# Patient Record
Sex: Male | Born: 2000 | Race: White | Hispanic: No | Marital: Married | Smoking: Former smoker
Health system: Southern US, Community
[De-identification: ages and names within clinical notes are randomized; demographics above are authoritative.]

---
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
## 2019-01-03 ENCOUNTER — Ambulatory Visit (INDEPENDENT_AMBULATORY_CARE_PROVIDER_SITE_OTHER): Payer: Medicaid Other | Admitting: *Deleted

## 2019-01-03 DIAGNOSIS — J309 Allergic rhinitis, unspecified: Secondary | ICD-10-CM

## 2019-01-16 ENCOUNTER — Ambulatory Visit (INDEPENDENT_AMBULATORY_CARE_PROVIDER_SITE_OTHER): Payer: Medicaid Other | Admitting: *Deleted

## 2019-01-16 DIAGNOSIS — J309 Allergic rhinitis, unspecified: Secondary | ICD-10-CM

## 2019-01-21 ENCOUNTER — Ambulatory Visit (INDEPENDENT_AMBULATORY_CARE_PROVIDER_SITE_OTHER): Payer: Medicaid Other | Admitting: *Deleted

## 2019-01-21 DIAGNOSIS — J309 Allergic rhinitis, unspecified: Secondary | ICD-10-CM | POA: Diagnosis not present

## 2019-02-07 ENCOUNTER — Ambulatory Visit (INDEPENDENT_AMBULATORY_CARE_PROVIDER_SITE_OTHER): Payer: Medicaid Other | Admitting: *Deleted

## 2019-02-07 DIAGNOSIS — J309 Allergic rhinitis, unspecified: Secondary | ICD-10-CM | POA: Diagnosis not present

## 2019-02-15 ENCOUNTER — Ambulatory Visit (INDEPENDENT_AMBULATORY_CARE_PROVIDER_SITE_OTHER): Payer: Medicaid Other | Admitting: *Deleted

## 2019-02-15 DIAGNOSIS — J309 Allergic rhinitis, unspecified: Secondary | ICD-10-CM

## 2019-02-22 ENCOUNTER — Ambulatory Visit (INDEPENDENT_AMBULATORY_CARE_PROVIDER_SITE_OTHER): Payer: Medicaid Other | Admitting: *Deleted

## 2019-02-22 DIAGNOSIS — J309 Allergic rhinitis, unspecified: Secondary | ICD-10-CM

## 2019-02-28 ENCOUNTER — Ambulatory Visit (INDEPENDENT_AMBULATORY_CARE_PROVIDER_SITE_OTHER): Payer: Medicaid Other

## 2019-02-28 DIAGNOSIS — J309 Allergic rhinitis, unspecified: Secondary | ICD-10-CM | POA: Diagnosis not present

## 2019-03-14 ENCOUNTER — Ambulatory Visit (INDEPENDENT_AMBULATORY_CARE_PROVIDER_SITE_OTHER): Payer: Medicaid Other | Admitting: *Deleted

## 2019-03-14 DIAGNOSIS — J309 Allergic rhinitis, unspecified: Secondary | ICD-10-CM | POA: Diagnosis not present

## 2019-03-21 ENCOUNTER — Ambulatory Visit (INDEPENDENT_AMBULATORY_CARE_PROVIDER_SITE_OTHER): Payer: Medicaid Other | Admitting: *Deleted

## 2019-03-21 DIAGNOSIS — J309 Allergic rhinitis, unspecified: Secondary | ICD-10-CM | POA: Diagnosis not present

## 2019-03-25 ENCOUNTER — Ambulatory Visit (INDEPENDENT_AMBULATORY_CARE_PROVIDER_SITE_OTHER): Payer: Medicaid Other

## 2019-03-25 DIAGNOSIS — J309 Allergic rhinitis, unspecified: Secondary | ICD-10-CM | POA: Diagnosis not present

## 2019-04-03 ENCOUNTER — Ambulatory Visit (INDEPENDENT_AMBULATORY_CARE_PROVIDER_SITE_OTHER): Payer: Medicaid Other

## 2019-04-03 DIAGNOSIS — J309 Allergic rhinitis, unspecified: Secondary | ICD-10-CM | POA: Diagnosis not present

## 2019-04-11 ENCOUNTER — Ambulatory Visit (INDEPENDENT_AMBULATORY_CARE_PROVIDER_SITE_OTHER): Payer: Medicaid Other | Admitting: *Deleted

## 2019-04-11 DIAGNOSIS — J309 Allergic rhinitis, unspecified: Secondary | ICD-10-CM

## 2019-04-17 ENCOUNTER — Ambulatory Visit (INDEPENDENT_AMBULATORY_CARE_PROVIDER_SITE_OTHER): Payer: Medicaid Other | Admitting: *Deleted

## 2019-04-17 DIAGNOSIS — J309 Allergic rhinitis, unspecified: Secondary | ICD-10-CM

## 2019-04-24 ENCOUNTER — Ambulatory Visit (INDEPENDENT_AMBULATORY_CARE_PROVIDER_SITE_OTHER): Payer: Medicaid Other

## 2019-04-24 DIAGNOSIS — J309 Allergic rhinitis, unspecified: Secondary | ICD-10-CM | POA: Diagnosis not present

## 2019-05-08 ENCOUNTER — Ambulatory Visit (INDEPENDENT_AMBULATORY_CARE_PROVIDER_SITE_OTHER): Payer: Medicaid Other | Admitting: *Deleted

## 2019-05-08 DIAGNOSIS — J309 Allergic rhinitis, unspecified: Secondary | ICD-10-CM | POA: Diagnosis not present

## 2019-05-13 ENCOUNTER — Ambulatory Visit (INDEPENDENT_AMBULATORY_CARE_PROVIDER_SITE_OTHER): Payer: Medicaid Other | Admitting: *Deleted

## 2019-05-13 DIAGNOSIS — J309 Allergic rhinitis, unspecified: Secondary | ICD-10-CM

## 2019-05-22 ENCOUNTER — Ambulatory Visit (INDEPENDENT_AMBULATORY_CARE_PROVIDER_SITE_OTHER): Payer: Medicaid Other

## 2019-05-22 DIAGNOSIS — J309 Allergic rhinitis, unspecified: Secondary | ICD-10-CM | POA: Diagnosis not present

## 2019-05-22 MED ORDER — EPINEPHRINE 0.3 MG/0.3ML IJ SOAJ: 0.3000 mg | INTRAMUSCULAR | 3 refills | Status: DC | PRN

## 2019-05-22 NOTE — Progress Notes (Signed)
Reaction note Patient was given 0.5 mL red vial of the grass/tree and 0.5 mL of dust mitt/weed. Patient notified of the error in dosing and continued to wait in the waiting area. After 15 minutes he reported feeling itchy and scattered hives appeared. He was taken into an exam room where his hives continued to increase. He denied shortness of breath, dizziness, and abdominal pain. He was given Benadryl 50 mg, famotidine 20 mg, and cetirizine 10 mg. His hives persisted and Dr. Selena Batten was notified. He was given 0.3 mg epinephrine. Vital signs are stable and patient is resting in the recumbent position. His family is getting the EpiPen prescription filled at the pharmacy and will drive him home after we release him. Patient informed about possible biphasic reaction and questions are answered. He will call the clinic or 911 with any further reactions. Prednisone 30 mg given one time before leaving the clinic.

## 2019-05-23 MED ORDER — EPINEPHRINE (ANAPHYLAXIS) 1 MG/ML IJ SOLN
0.3000 mL | Freq: Once | INTRAMUSCULAR | Status: AC
  Administered 2019-05-23: 0.3 mg via INTRAMUSCULAR

## 2019-05-23 NOTE — Progress Notes (Signed)
Patient received 0.50 mL out of grass/tree first red vial and 0.50 mL out of dust mite/weed first red vial. Thurston Hole was notified of dosing error and patient was notified of the dosing error and agreed to wait in office for 30 minutes for monitoring. After 15-20 minutes, patient began reporting symptoms of itching and showing signs of hives on arms, neck and torso. I was instructed to give him Benadryl 50mg , Famotidine 20mg , and Cetirizine 10 mg. After 20 minutes, his hives persisted and Dr. was called and she instructed me to administer Epi 0.3mg . Vital signs were monitored. Patient stated he did not have a epipen so one was sent to his pharmacy and his mother went to pharmacy and picked it up then came to the office. Patient was aware that he would need a driver. Patient and mom were educated on a possible second reaction and informed to administer epi and call 911 should he have another reaction.  I called the patient this morning (05/23/2019) and he stated that he remained stable the rest of the night and slept well and no other reactions occurred.

## 2019-05-30 ENCOUNTER — Ambulatory Visit (INDEPENDENT_AMBULATORY_CARE_PROVIDER_SITE_OTHER): Payer: Medicaid Other | Admitting: *Deleted

## 2019-05-30 DIAGNOSIS — J309 Allergic rhinitis, unspecified: Secondary | ICD-10-CM | POA: Diagnosis not present

## 2019-06-07 ENCOUNTER — Ambulatory Visit (INDEPENDENT_AMBULATORY_CARE_PROVIDER_SITE_OTHER): Payer: Medicaid Other

## 2019-06-07 DIAGNOSIS — J309 Allergic rhinitis, unspecified: Secondary | ICD-10-CM | POA: Diagnosis not present

## 2019-06-14 ENCOUNTER — Ambulatory Visit (INDEPENDENT_AMBULATORY_CARE_PROVIDER_SITE_OTHER): Payer: Medicaid Other

## 2019-06-14 DIAGNOSIS — J309 Allergic rhinitis, unspecified: Secondary | ICD-10-CM | POA: Diagnosis not present

## 2019-06-25 ENCOUNTER — Ambulatory Visit (INDEPENDENT_AMBULATORY_CARE_PROVIDER_SITE_OTHER): Payer: Medicaid Other | Admitting: *Deleted

## 2019-06-25 DIAGNOSIS — J309 Allergic rhinitis, unspecified: Secondary | ICD-10-CM

## 2019-07-08 ENCOUNTER — Ambulatory Visit (INDEPENDENT_AMBULATORY_CARE_PROVIDER_SITE_OTHER): Payer: Medicaid Other | Admitting: *Deleted

## 2019-07-08 DIAGNOSIS — J309 Allergic rhinitis, unspecified: Secondary | ICD-10-CM

## 2019-07-15 ENCOUNTER — Ambulatory Visit (INDEPENDENT_AMBULATORY_CARE_PROVIDER_SITE_OTHER): Payer: Medicaid Other | Admitting: *Deleted

## 2019-07-15 DIAGNOSIS — J309 Allergic rhinitis, unspecified: Secondary | ICD-10-CM | POA: Diagnosis not present

## 2019-07-16 DIAGNOSIS — J3089 Other allergic rhinitis: Secondary | ICD-10-CM

## 2019-07-16 NOTE — Progress Notes (Signed)
VIALS EXP 07-15-20 

## 2019-08-02 ENCOUNTER — Ambulatory Visit (INDEPENDENT_AMBULATORY_CARE_PROVIDER_SITE_OTHER): Payer: Medicaid Other | Admitting: *Deleted

## 2019-08-02 DIAGNOSIS — J309 Allergic rhinitis, unspecified: Secondary | ICD-10-CM

## 2019-08-08 ENCOUNTER — Ambulatory Visit (INDEPENDENT_AMBULATORY_CARE_PROVIDER_SITE_OTHER): Payer: Medicaid Other | Admitting: *Deleted

## 2019-08-08 DIAGNOSIS — J309 Allergic rhinitis, unspecified: Secondary | ICD-10-CM

## 2019-08-12 ENCOUNTER — Ambulatory Visit (INDEPENDENT_AMBULATORY_CARE_PROVIDER_SITE_OTHER): Payer: Medicaid Other | Admitting: *Deleted

## 2019-08-12 DIAGNOSIS — J309 Allergic rhinitis, unspecified: Secondary | ICD-10-CM

## 2019-08-20 ENCOUNTER — Ambulatory Visit (INDEPENDENT_AMBULATORY_CARE_PROVIDER_SITE_OTHER): Payer: Medicaid Other | Admitting: *Deleted

## 2019-08-20 DIAGNOSIS — J309 Allergic rhinitis, unspecified: Secondary | ICD-10-CM | POA: Diagnosis not present

## 2019-08-26 ENCOUNTER — Ambulatory Visit (INDEPENDENT_AMBULATORY_CARE_PROVIDER_SITE_OTHER): Payer: Medicaid Other | Admitting: *Deleted

## 2019-08-26 DIAGNOSIS — J309 Allergic rhinitis, unspecified: Secondary | ICD-10-CM | POA: Diagnosis not present

## 2019-09-20 ENCOUNTER — Ambulatory Visit (INDEPENDENT_AMBULATORY_CARE_PROVIDER_SITE_OTHER): Payer: Medicaid Other

## 2019-09-20 DIAGNOSIS — J309 Allergic rhinitis, unspecified: Secondary | ICD-10-CM | POA: Diagnosis not present

## 2019-09-27 ENCOUNTER — Ambulatory Visit (INDEPENDENT_AMBULATORY_CARE_PROVIDER_SITE_OTHER): Payer: Medicaid Other | Admitting: *Deleted

## 2019-09-27 DIAGNOSIS — J309 Allergic rhinitis, unspecified: Secondary | ICD-10-CM | POA: Diagnosis not present

## 2019-10-02 ENCOUNTER — Ambulatory Visit (INDEPENDENT_AMBULATORY_CARE_PROVIDER_SITE_OTHER): Payer: Medicaid Other | Admitting: *Deleted

## 2019-10-02 DIAGNOSIS — J309 Allergic rhinitis, unspecified: Secondary | ICD-10-CM

## 2019-10-09 ENCOUNTER — Ambulatory Visit (INDEPENDENT_AMBULATORY_CARE_PROVIDER_SITE_OTHER): Payer: Medicaid Other | Admitting: *Deleted

## 2019-10-09 DIAGNOSIS — J309 Allergic rhinitis, unspecified: Secondary | ICD-10-CM | POA: Diagnosis not present

## 2019-10-16 ENCOUNTER — Ambulatory Visit (INDEPENDENT_AMBULATORY_CARE_PROVIDER_SITE_OTHER): Payer: Medicaid Other | Admitting: *Deleted

## 2019-10-16 DIAGNOSIS — J309 Allergic rhinitis, unspecified: Secondary | ICD-10-CM

## 2019-10-21 ENCOUNTER — Ambulatory Visit (INDEPENDENT_AMBULATORY_CARE_PROVIDER_SITE_OTHER): Payer: Medicaid Other

## 2019-10-21 DIAGNOSIS — J309 Allergic rhinitis, unspecified: Secondary | ICD-10-CM

## 2019-11-01 ENCOUNTER — Ambulatory Visit (INDEPENDENT_AMBULATORY_CARE_PROVIDER_SITE_OTHER): Payer: Medicaid Other

## 2019-11-01 DIAGNOSIS — J309 Allergic rhinitis, unspecified: Secondary | ICD-10-CM | POA: Diagnosis not present

## 2019-11-19 DIAGNOSIS — J301 Allergic rhinitis due to pollen: Secondary | ICD-10-CM

## 2019-11-19 NOTE — Progress Notes (Signed)
Vials exp 11-18-20

## 2019-11-20 ENCOUNTER — Ambulatory Visit (INDEPENDENT_AMBULATORY_CARE_PROVIDER_SITE_OTHER): Payer: Medicaid Other | Admitting: *Deleted

## 2019-11-20 DIAGNOSIS — J309 Allergic rhinitis, unspecified: Secondary | ICD-10-CM

## 2019-11-21 DIAGNOSIS — J3089 Other allergic rhinitis: Secondary | ICD-10-CM

## 2019-11-25 ENCOUNTER — Ambulatory Visit (INDEPENDENT_AMBULATORY_CARE_PROVIDER_SITE_OTHER): Payer: Medicaid Other | Admitting: *Deleted

## 2019-11-25 DIAGNOSIS — J309 Allergic rhinitis, unspecified: Secondary | ICD-10-CM

## 2019-12-02 ENCOUNTER — Ambulatory Visit (INDEPENDENT_AMBULATORY_CARE_PROVIDER_SITE_OTHER): Payer: Medicaid Other | Admitting: *Deleted

## 2019-12-02 DIAGNOSIS — J309 Allergic rhinitis, unspecified: Secondary | ICD-10-CM

## 2020-01-01 ENCOUNTER — Ambulatory Visit (INDEPENDENT_AMBULATORY_CARE_PROVIDER_SITE_OTHER): Payer: Medicaid Other | Admitting: *Deleted

## 2020-01-01 DIAGNOSIS — J309 Allergic rhinitis, unspecified: Secondary | ICD-10-CM

## 2020-01-09 ENCOUNTER — Ambulatory Visit (INDEPENDENT_AMBULATORY_CARE_PROVIDER_SITE_OTHER): Payer: Medicaid Other | Admitting: *Deleted

## 2020-01-09 DIAGNOSIS — J309 Allergic rhinitis, unspecified: Secondary | ICD-10-CM

## 2020-01-21 ENCOUNTER — Ambulatory Visit (INDEPENDENT_AMBULATORY_CARE_PROVIDER_SITE_OTHER): Payer: Medicaid Other | Admitting: *Deleted

## 2020-01-21 DIAGNOSIS — J309 Allergic rhinitis, unspecified: Secondary | ICD-10-CM | POA: Diagnosis not present

## 2020-01-30 ENCOUNTER — Ambulatory Visit (INDEPENDENT_AMBULATORY_CARE_PROVIDER_SITE_OTHER): Payer: Medicaid Other

## 2020-01-30 DIAGNOSIS — J309 Allergic rhinitis, unspecified: Secondary | ICD-10-CM

## 2020-02-03 ENCOUNTER — Ambulatory Visit (INDEPENDENT_AMBULATORY_CARE_PROVIDER_SITE_OTHER): Payer: Medicaid Other

## 2020-02-03 DIAGNOSIS — J309 Allergic rhinitis, unspecified: Secondary | ICD-10-CM

## 2020-02-26 ENCOUNTER — Ambulatory Visit (INDEPENDENT_AMBULATORY_CARE_PROVIDER_SITE_OTHER): Payer: Medicaid Other

## 2020-02-26 DIAGNOSIS — J309 Allergic rhinitis, unspecified: Secondary | ICD-10-CM | POA: Diagnosis not present

## 2020-03-02 ENCOUNTER — Ambulatory Visit (INDEPENDENT_AMBULATORY_CARE_PROVIDER_SITE_OTHER): Payer: Medicaid Other | Admitting: *Deleted

## 2020-03-02 DIAGNOSIS — J309 Allergic rhinitis, unspecified: Secondary | ICD-10-CM | POA: Diagnosis not present

## 2020-03-16 ENCOUNTER — Ambulatory Visit (INDEPENDENT_AMBULATORY_CARE_PROVIDER_SITE_OTHER): Payer: Medicaid Other | Admitting: *Deleted

## 2020-03-16 DIAGNOSIS — J309 Allergic rhinitis, unspecified: Secondary | ICD-10-CM

## 2020-03-17 DIAGNOSIS — J302 Other seasonal allergic rhinitis: Secondary | ICD-10-CM

## 2020-03-17 NOTE — Progress Notes (Signed)
VIALS EXP 03-17-21 °

## 2020-03-18 DIAGNOSIS — J3089 Other allergic rhinitis: Secondary | ICD-10-CM

## 2020-03-25 ENCOUNTER — Encounter: Payer: Self-pay | Admitting: Allergy and Immunology

## 2020-03-30 ENCOUNTER — Ambulatory Visit (INDEPENDENT_AMBULATORY_CARE_PROVIDER_SITE_OTHER): Payer: Medicaid Other

## 2020-03-30 DIAGNOSIS — J309 Allergic rhinitis, unspecified: Secondary | ICD-10-CM

## 2020-04-27 ENCOUNTER — Ambulatory Visit (INDEPENDENT_AMBULATORY_CARE_PROVIDER_SITE_OTHER): Payer: Medicaid Other

## 2020-04-27 DIAGNOSIS — J309 Allergic rhinitis, unspecified: Secondary | ICD-10-CM

## 2020-05-21 ENCOUNTER — Ambulatory Visit (INDEPENDENT_AMBULATORY_CARE_PROVIDER_SITE_OTHER): Payer: Medicaid Other | Admitting: *Deleted

## 2020-05-21 DIAGNOSIS — J309 Allergic rhinitis, unspecified: Secondary | ICD-10-CM

## 2020-05-28 ENCOUNTER — Ambulatory Visit (INDEPENDENT_AMBULATORY_CARE_PROVIDER_SITE_OTHER): Payer: Medicaid Other | Admitting: *Deleted

## 2020-05-28 DIAGNOSIS — J309 Allergic rhinitis, unspecified: Secondary | ICD-10-CM | POA: Diagnosis not present

## 2020-06-08 ENCOUNTER — Ambulatory Visit (INDEPENDENT_AMBULATORY_CARE_PROVIDER_SITE_OTHER): Payer: Medicaid Other | Admitting: *Deleted

## 2020-06-08 DIAGNOSIS — J309 Allergic rhinitis, unspecified: Secondary | ICD-10-CM
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
## 2020-06-15 ENCOUNTER — Ambulatory Visit (INDEPENDENT_AMBULATORY_CARE_PROVIDER_SITE_OTHER): Payer: Medicaid Other

## 2020-06-15 DIAGNOSIS — J309 Allergic rhinitis, unspecified: Secondary | ICD-10-CM

## 2020-07-01 ENCOUNTER — Ambulatory Visit (INDEPENDENT_AMBULATORY_CARE_PROVIDER_SITE_OTHER): Payer: Medicaid Other

## 2020-07-01 DIAGNOSIS — J309 Allergic rhinitis, unspecified: Secondary | ICD-10-CM

## 2020-07-09 ENCOUNTER — Ambulatory Visit (INDEPENDENT_AMBULATORY_CARE_PROVIDER_SITE_OTHER): Payer: Medicaid Other | Admitting: *Deleted

## 2020-07-09 DIAGNOSIS — J309 Allergic rhinitis, unspecified: Secondary | ICD-10-CM | POA: Diagnosis not present

## 2020-07-16 ENCOUNTER — Ambulatory Visit (INDEPENDENT_AMBULATORY_CARE_PROVIDER_SITE_OTHER): Payer: Medicaid Other | Admitting: *Deleted

## 2020-07-16 DIAGNOSIS — J309 Allergic rhinitis, unspecified: Secondary | ICD-10-CM

## 2020-08-17 ENCOUNTER — Ambulatory Visit (INDEPENDENT_AMBULATORY_CARE_PROVIDER_SITE_OTHER): Payer: Medicaid Other | Admitting: *Deleted

## 2020-08-17 DIAGNOSIS — J309 Allergic rhinitis, unspecified: Secondary | ICD-10-CM | POA: Diagnosis not present

## 2020-09-10 ENCOUNTER — Ambulatory Visit (INDEPENDENT_AMBULATORY_CARE_PROVIDER_SITE_OTHER): Payer: Medicaid Other | Admitting: *Deleted

## 2020-09-10 DIAGNOSIS — J309 Allergic rhinitis, unspecified: Secondary | ICD-10-CM

## 2020-09-23 ENCOUNTER — Ambulatory Visit (INDEPENDENT_AMBULATORY_CARE_PROVIDER_SITE_OTHER): Payer: Medicaid Other | Admitting: *Deleted

## 2020-09-23 DIAGNOSIS — J309 Allergic rhinitis, unspecified: Secondary | ICD-10-CM | POA: Diagnosis not present

## 2020-09-30 ENCOUNTER — Ambulatory Visit (INDEPENDENT_AMBULATORY_CARE_PROVIDER_SITE_OTHER): Payer: Medicaid Other | Admitting: *Deleted

## 2020-09-30 DIAGNOSIS — J309 Allergic rhinitis, unspecified: Secondary | ICD-10-CM

## 2020-10-07 ENCOUNTER — Ambulatory Visit (INDEPENDENT_AMBULATORY_CARE_PROVIDER_SITE_OTHER): Payer: Medicaid Other | Admitting: *Deleted

## 2020-10-07 DIAGNOSIS — J309 Allergic rhinitis, unspecified: Secondary | ICD-10-CM | POA: Diagnosis not present

## 2020-10-21 ENCOUNTER — Ambulatory Visit (INDEPENDENT_AMBULATORY_CARE_PROVIDER_SITE_OTHER): Payer: Medicaid Other | Admitting: *Deleted

## 2020-10-21 DIAGNOSIS — J309 Allergic rhinitis, unspecified: Secondary | ICD-10-CM | POA: Diagnosis not present

## 2020-10-27 DIAGNOSIS — J301 Allergic rhinitis due to pollen: Secondary | ICD-10-CM

## 2020-10-27 NOTE — Progress Notes (Signed)
VIALS MADE. EXP 10-27-21 

## 2020-10-28 DIAGNOSIS — J3089 Other allergic rhinitis: Secondary | ICD-10-CM

## 2020-11-04 ENCOUNTER — Ambulatory Visit (INDEPENDENT_AMBULATORY_CARE_PROVIDER_SITE_OTHER): Payer: Medicaid Other | Admitting: *Deleted

## 2020-11-04 DIAGNOSIS — J309 Allergic rhinitis, unspecified: Secondary | ICD-10-CM

## 2020-11-18 ENCOUNTER — Ambulatory Visit (INDEPENDENT_AMBULATORY_CARE_PROVIDER_SITE_OTHER): Payer: Medicaid Other | Admitting: *Deleted

## 2020-11-18 DIAGNOSIS — J309 Allergic rhinitis, unspecified: Secondary | ICD-10-CM | POA: Diagnosis not present

## 2020-12-02 ENCOUNTER — Ambulatory Visit (INDEPENDENT_AMBULATORY_CARE_PROVIDER_SITE_OTHER): Payer: Medicaid Other | Admitting: *Deleted

## 2020-12-02 DIAGNOSIS — J309 Allergic rhinitis, unspecified: Secondary | ICD-10-CM | POA: Diagnosis not present

## 2020-12-16 ENCOUNTER — Ambulatory Visit: Payer: Medicaid Other | Admitting: Allergy and Immunology

## 2020-12-23 ENCOUNTER — Ambulatory Visit: Payer: Medicaid Other | Admitting: Allergy and Immunology

## 2020-12-23 ENCOUNTER — Other Ambulatory Visit: Payer: Self-pay

## 2020-12-23 ENCOUNTER — Encounter: Payer: Self-pay | Admitting: Allergy and Immunology

## 2020-12-23 ENCOUNTER — Ambulatory Visit (INDEPENDENT_AMBULATORY_CARE_PROVIDER_SITE_OTHER): Payer: Medicaid Other | Admitting: Allergy and Immunology

## 2020-12-23 VITALS — BP 110/62 | HR 64 | Resp 16 | Ht 65.0 in | Wt 111.8 lb

## 2020-12-23 DIAGNOSIS — J301 Allergic rhinitis due to pollen: Secondary | ICD-10-CM

## 2020-12-23 DIAGNOSIS — J309 Allergic rhinitis, unspecified: Secondary | ICD-10-CM | POA: Diagnosis not present

## 2020-12-23 DIAGNOSIS — J3089 Other allergic rhinitis: Secondary | ICD-10-CM

## 2020-12-23 NOTE — Progress Notes (Signed)
[place] - [place] - [place] - [place] - [place]   Follow-up Note  Referring Provider: Hal Morales, NP Primary Provider: Hal Morales, NP Date of Office Visit: 12/23/2020  Subjective:   Bryce Costa (DOB: 2001-01-30) is a 20 y.o. male who [facility] in re-evaluation of the following:  HPI: [name] returns to this clinic in evaluation of allergic rhinitis treated with immunotherapy.  He is currently using his immunotherapy every 2 weeks with a very good effect.  He has been able to go through each season of the year with no difficulty at all and does not use any other medications at this point in time to address this issue.  Allergies as of 12/23/2020       Reactions   Wasp Venom Anaphylaxis        Medication List    EPINEPHrine 0.3 mg/0.3 mL Soaj injection Commonly known as: EPI-PEN Use for life-threatening allergic reactions   triamcinolone 55 MCG/ACT Aero nasal inhaler Commonly known as: NASACORT Use 1-2 sprays in each nostril daily    History reviewed. No pertinent past medical history.  History reviewed. No pertinent surgical history.  Review of systems negative except as noted in HPI / PMHx or noted below:  Review of Systems  Constitutional: Negative.   HENT: Negative.    Eyes: Negative.   Respiratory: Negative.    Cardiovascular: Negative.   Gastrointestinal: Negative.   Genitourinary: Negative.   Musculoskeletal: Negative.   Skin: Negative.   Neurological: Negative.   Endo/Heme/Allergies: Negative.   Psychiatric/Behavioral: Negative.      Objective:   Vitals:   12/23/20 1418  BP: 110/62  Pulse: 64  Resp: 16  SpO2: 98%   Height: 5\' 5"  (165.1 cm)  Weight: 111 lb 12.8 oz (50.7 kg)   Physical Exam Constitutional:      Appearance: He is not diaphoretic.  HENT:     Head: Normocephalic.     Right Ear: Tympanic membrane, ear canal and external ear normal.     Left Ear: Tympanic  membrane, ear canal and external ear normal.     Nose: Nose normal. No mucosal edema or rhinorrhea.     Mouth/Throat:     Pharynx: Uvula midline. No oropharyngeal exudate.  Eyes:     Conjunctiva/sclera: Conjunctivae normal.  Neck:     Thyroid: No thyromegaly.     Trachea: Trachea normal. No tracheal tenderness or tracheal deviation.  Cardiovascular:     Rate and Rhythm: Normal rate and regular rhythm.     Heart sounds: Normal heart sounds, S1 normal and S2 normal. No murmur heard. Pulmonary:     Effort: No respiratory distress.     Breath sounds: Normal breath sounds. No stridor. No wheezing or rales.  Lymphadenopathy:     Head:     Right side of head: No tonsillar adenopathy.     Left side of head: No tonsillar adenopathy.     Cervical: No cervical adenopathy.  Skin:    Findings: No erythema or rash.     Nails: There is no clubbing.  Neurological:     Mental Status: He is alert.    Diagnostics: none   Assessment and Plan:   1. Perennial allergic rhinitis   2. Seasonal allergic rhinitis due to pollen    1.  Continue immunotherapy  2.  Continue EpiPen  3.  Obtain fall flu vaccine  4.  Return to clinic in 1 year or earlier  if problem  Bryce Costa will continue to utilize immunotherapy as it is resulted in dramatic improvement regarding his conjunctival and upper respiratory tract symptoms.  Assuming he does well with the plan noted above I will see him back in this clinic in 1 year or earlier if there is a problem.  Laurette Schimke, MD Allergy / Immunology [facility]

## 2020-12-23 NOTE — Patient Instructions (Addendum)
  1.  Continue immunotherapy  2.  Continue EpiPen  3.  Obtain fall flu vaccine  4.  Return to clinic in 1 year or earlier if problem

## 2020-12-24 ENCOUNTER — Encounter: Payer: Self-pay | Admitting: Allergy and Immunology

## 2021-01-06 ENCOUNTER — Ambulatory Visit (INDEPENDENT_AMBULATORY_CARE_PROVIDER_SITE_OTHER): Payer: Medicaid Other | Admitting: *Deleted

## 2021-01-06 DIAGNOSIS — J309 Allergic rhinitis, unspecified: Secondary | ICD-10-CM | POA: Diagnosis not present

## 2021-01-19 ENCOUNTER — Ambulatory Visit (INDEPENDENT_AMBULATORY_CARE_PROVIDER_SITE_OTHER): Payer: Medicaid Other | Admitting: *Deleted

## 2021-01-19 DIAGNOSIS — J309 Allergic rhinitis, unspecified: Secondary | ICD-10-CM | POA: Diagnosis not present

## 2021-01-27 ENCOUNTER — Ambulatory Visit (INDEPENDENT_AMBULATORY_CARE_PROVIDER_SITE_OTHER): Payer: Medicaid Other | Admitting: *Deleted

## 2021-01-27 DIAGNOSIS — J309 Allergic rhinitis, unspecified: Secondary | ICD-10-CM

## 2022-06-27 DIAGNOSIS — K5900 Constipation, unspecified: Secondary | ICD-10-CM | POA: Diagnosis not present

## 2022-06-27 DIAGNOSIS — R519 Headache, unspecified: Secondary | ICD-10-CM | POA: Diagnosis not present

## 2022-06-27 DIAGNOSIS — K2900 Acute gastritis without bleeding: Secondary | ICD-10-CM | POA: Diagnosis not present

## 2022-10-24 DIAGNOSIS — Z6820 Body mass index (BMI) 20.0-20.9, adult: Secondary | ICD-10-CM | POA: Diagnosis not present

## 2022-10-24 DIAGNOSIS — Z13220 Encounter for screening for lipoid disorders: Secondary | ICD-10-CM | POA: Diagnosis not present

## 2022-10-24 DIAGNOSIS — Z0000 Encounter for general adult medical examination without abnormal findings: Secondary | ICD-10-CM | POA: Diagnosis not present

## 2022-10-24 DIAGNOSIS — Z1331 Encounter for screening for depression: Secondary | ICD-10-CM | POA: Diagnosis not present

## 2022-10-26 ENCOUNTER — Other Ambulatory Visit: Payer: Self-pay

## 2022-10-26 ENCOUNTER — Observation Stay (HOSPITAL_COMMUNITY)
Admission: AD | Admit: 2022-10-26 | Discharge: 2022-10-27 | Disposition: A | Discharge status: Home / Self Care | Payer: BC Managed Care – PPO | Source: Ambulatory Visit | Attending: Family Medicine | Admitting: Family Medicine

## 2022-10-26 ENCOUNTER — Encounter (HOSPITAL_COMMUNITY): Payer: Self-pay

## 2022-10-26 ENCOUNTER — Encounter (HOSPITAL_COMMUNITY): Payer: Self-pay | Admitting: Internal Medicine

## 2022-10-26 DIAGNOSIS — I498 Other specified cardiac arrhythmias: Secondary | ICD-10-CM | POA: Diagnosis not present

## 2022-10-26 DIAGNOSIS — Z1152 Encounter for screening for COVID-19: Secondary | ICD-10-CM | POA: Diagnosis not present

## 2022-10-26 DIAGNOSIS — F1290 Cannabis use, unspecified, uncomplicated: Secondary | ICD-10-CM | POA: Diagnosis not present

## 2022-10-26 DIAGNOSIS — I319 Disease of pericardium, unspecified: Secondary | ICD-10-CM | POA: Diagnosis not present

## 2022-10-26 DIAGNOSIS — B9562 Methicillin resistant Staphylococcus aureus infection as the cause of diseases classified elsewhere: Secondary | ICD-10-CM | POA: Diagnosis not present

## 2022-10-26 DIAGNOSIS — R9431 Abnormal electrocardiogram [ECG] [EKG]: Secondary | ICD-10-CM | POA: Diagnosis not present

## 2022-10-26 DIAGNOSIS — I4519 Other right bundle-branch block: Secondary | ICD-10-CM | POA: Diagnosis not present

## 2022-10-26 DIAGNOSIS — R7989 Other specified abnormal findings of blood chemistry: Secondary | ICD-10-CM | POA: Diagnosis not present

## 2022-10-26 DIAGNOSIS — Z87891 Personal history of nicotine dependence: Secondary | ICD-10-CM | POA: Insufficient documentation

## 2022-10-26 DIAGNOSIS — I514 Myocarditis, unspecified: Secondary | ICD-10-CM | POA: Diagnosis not present

## 2022-10-26 DIAGNOSIS — L739 Follicular disorder, unspecified: Secondary | ICD-10-CM | POA: Insufficient documentation

## 2022-10-26 DIAGNOSIS — R079 Chest pain, unspecified: Secondary | ICD-10-CM | POA: Diagnosis not present

## 2022-10-26 DIAGNOSIS — I214 Non-ST elevation (NSTEMI) myocardial infarction: Secondary | ICD-10-CM | POA: Diagnosis not present

## 2022-10-26 LAB — RESPIRATORY PANEL BY PCR

## 2022-10-26 LAB — SARS CORONAVIRUS 2 BY RT PCR: SARS Coronavirus 2 by RT PCR: NEGATIVE

## 2022-10-26 LAB — HIV ANTIBODY (ROUTINE TESTING W REFLEX): HIV Screen 4th Generation wRfx: NONREACTIVE

## 2022-10-26 MED ORDER — SULFAMETHOXAZOLE-TRIMETHOPRIM 800-160 MG PO TABS
1.0000 | ORAL_TABLET | Freq: Two times a day (BID) | ORAL | Status: DC
  Administered 2022-10-26 – 2022-10-27 (×2): 1 via ORAL
  Filled 2022-10-26 (×3): qty 1

## 2022-10-26 MED ORDER — ACETAMINOPHEN 325 MG PO TABS
650.0000 mg | ORAL_TABLET | ORAL | Status: DC | PRN
  Administered 2022-10-26: 650 mg via ORAL
  Filled 2022-10-26: qty 2

## 2022-10-26 MED ORDER — ONDANSETRON HCL 4 MG/2ML IJ SOLN: 4.0000 mg | Freq: Four times a day (QID) | INTRAMUSCULAR | Status: DC | PRN

## 2022-10-26 NOTE — H&P (Signed)
History and Physical   Bryce Costa ZOX:[id number] DOB: [birth year] DOA: 10/26/2022  PCP: Hal Morales, NP   Patient coming from: [facility]  Chief Complaint: Chest pain  HPI: Bryce Costa is a 22 y.o. male with medical history significant of folliculitis, allergic reaction presenting with chest pain as a transfer from [facility].  Patient presented to [facility] with chest pain earlier this morning.  He had sudden onset chest pain around 2 AM that radiated to his right arm.  At first he thought maybe it was a muscle spasm try to stretch it without relief.  EMS was called and patient was transported to the [facility].  En route he had some nausea and vomiting which is atypical for him.  Episode lasted around 30 minutes.  Chest pain-free at this time.  Additionally does report a week or so some vague GI symptoms but he states he has these chronically with lactose intolerance and acid reflux.  He also reports some  intermittent headaches for the last week as well but no headaches today.  He additionally reports 2 lesions on his left leg similar to previous MRSA folliculitis that he takes Bactrim for as he has a history of having these occur on and off.  He denies fevers, chills, abdominal pain, shortness of breath, constipation, diarrhea.  ED Course: Vital signs at [facility] ED and thus far here are stable.  Lab workup included CMP within normal limits, CBC within normal limits, PT and INR within normal limits, D-dimer within normal limits, normal ESR, normal CRP.  Troponin elevated to 1.25, up trended to 2.15 where it peaked and then down trended to 2.1.  Reportedly had a CTA PE study performed which was negative for PE or other acute abnormality.  He was initially started on heparin and received aspirin and Tylenol.  Patient was accepted for transfer by cardiology service here.  Cardiology they are consulted and are okay with transfer, felt pericarditis/myocarditis would be  primary concern.  Reportedly echo was done there which showed no wall motion abnormality and normal EF.  Patient transferred and upon cardiology evaluation here.  Plan for cardiac MRI but asked for hospitalist to admit as on their initial evaluation they were concerned that issues were noncardiac in nature.  Review of Systems: As per HPI otherwise all other systems reviewed and are negative.  History reviewed. No pertinent past medical history.  History reviewed. No pertinent surgical history.  Social History  reports that he has quit smoking. His smoking use included e-cigarettes. He has never used smokeless tobacco. He reports that he does not drink alcohol and does not use drugs.  Allergies  Allergen Reactions   Wasp Venom Anaphylaxis    History reviewed. No pertinent family history.   Prior to Admission medications   Medication Sig Start Date End Date Taking? Authorizing Provider  EPINEPHrine 0.3 mg/0.3 mL IJ SOAJ injection Use for life-threatening allergic reactions 10/29/18   Kozlow, Alvira Philips, MD  triamcinolone (NASACORT) 55 MCG/ACT AERO nasal inhaler Use 1-2 sprays in each nostril daily 10/08/18   Jessica Priest, MD  Bactrim  Physical Exam: Vitals:   10/26/22 1440 10/26/22 1510 10/26/22 1553 10/26/22 1636  BP: 116/67 (!) 95/56  118/73  Pulse: 87 64  75  Resp: 19   15  Temp: 98.6 F (37 C)   98.3 F (36.8 C)  TempSrc: Oral   Oral  SpO2: 100% 97%  99%  Weight:   54.9 kg   Height:  5\' 5"  (1.651 m)     Physical Exam Constitutional:      General: He is not in acute distress.    Appearance: Normal appearance.  HENT:     Head: Normocephalic and atraumatic.     Mouth/Throat:     Mouth: Mucous membranes are moist.     Pharynx: Oropharynx is clear.  Eyes:     Extraocular Movements: Extraocular movements intact.     Pupils: Pupils are equal, round, and reactive to light.  Cardiovascular:     Rate and Rhythm: Normal rate and regular rhythm.     Pulses: Normal pulses.      Heart sounds: Normal heart sounds.  Pulmonary:     Effort: Pulmonary effort is normal. No respiratory distress.     Breath sounds: Normal breath sounds.  Abdominal:     General: Bowel sounds are normal. There is no distension.     Palpations: Abdomen is soft.     Tenderness: There is no abdominal tenderness.  Musculoskeletal:        General: No swelling or deformity.  Skin:    General: Skin is warm and dry.     Findings: Lesion (2 folliculitis type lesions on left knee and left hip) present.  Neurological:     General: No focal deficit present.     Mental Status: Mental status is at baseline.     Labs on Admission: I have personally reviewed following labs and imaging studies  CBC: No results for input(s): "WBC", "NEUTROABS", "HGB", "HCT", "MCV", "PLT" in the last 168 hours.  Basic Metabolic Panel: No results for input(s): "NA", "K", "CL", "CO2", "GLUCOSE", "BUN", "CREATININE", "CALCIUM", "MG", "PHOS" in the last 168 hours.  GFR: CrCl cannot be calculated (No successful lab value found.).  Liver Function Tests: No results for input(s): "AST", "ALT", "ALKPHOS", "BILITOT", "PROT", "ALBUMIN" in the last 168 hours.  Urine analysis: No results found for: "COLORURINE", "APPEARANCEUR", "LABSPEC", "PHURINE", "GLUCOSEU", "HGBUR", "BILIRUBINUR", "KETONESUR", "PROTEINUR", "UROBILINOGEN", "NITRITE", "LEUKOCYTESUR"  Radiological Exams on Admission: No results found.  EKG: Not performed here.  Per cardiology, had some nonspecific T wave changes on outside EKG.  Assessment/Plan Principal Problem:   Chest pain Active Problems:   Elevated troponin   Folliculitis   Chest pain, rule out MI > Patient presenting with chest pain and elevated troponin that peaked to 2.15.  Lower  suspicion for CAD, possibly myocarditis/pericarditis. > Patient had some preceding nonspecific symptoms for a week with new headaches and some GI symptoms that are fairly typical for him.  Will check for viral  etiology as well with COVID and four-story viral panel. (Could also be etiology for myocarditis/pericarditis if present) > Cardiology has seen the patient and plan for cardiac  MRI. - Appreciate cardiology recommendations/assistance - Follow-up cardiac MRI - Repeat troponin/EKG if recurrence of chest pain  Folliculitis > 2 lytic lesions, appear to be folliculitis.  States he has a history of this and it has been MRSA in the past for which she has Bactrim which he restarted on Monday.  Since restarting Bactrim he has had improvement in the pain and decreasing redness around the lesions. > He is in the woods quite a bit, however these do not have the appearance of Lyme (low risk and high region as well) or RMSF at this time. > No Leukocytosis - Resume home Bactrim twice daily  DVT prophylaxis: None, mobile/well.  Discontinuing heparin drip started at outside hospital. Code Status:   Full Family Communication:  Updated at  bedside Disposition Plan:   Patient is from:  Home  Anticipated DC to:  Home  Anticipated DC date:  1 to 2 days  Anticipated DC barriers: None  Consults called:  Cardiology Admission status:  Observation, telemetry  Severity of Illness: The appropriate patient status for this patient is OBSERVATION. Observation status is judged to be reasonable and necessary in order to provide the required intensity of service to ensure the patient's safety. The patient's presenting symptoms, physical exam findings, and initial radiographic and laboratory data in the context of their medical condition is felt to place them at decreased risk for further clinical deterioration. Furthermore, it is anticipated that the patient will be medically stable for discharge from the hospital within 2 midnights of admission.    Synetta Fail MD [facility]  How to contact the [facility] Attending or Consulting provider 7A - 7P or covering provider during after hours 7P -7A, for this patient?    Check the care team in [facility] and look for a) attending/consulting TR[facility] provider listed and b) the [facility] team listed Log into [email or web address] and use [place] universal password to access. If you do not have the password, please contact the hospital operator. Locate the [facility] provider you are looking for under [facility] and page to a number that you can be directly reached. If you still have difficulty reaching the provider, please page the [facility] (Director on Call) for the Hospitalists listed on amion for assistance.  10/26/2022, 5:59 PM

## 2022-10-26 NOTE — Consult Note (Signed)
Cardiology Consultation   Patient ID: Bryce Costa MRN: [id number]; DOB: 2000/05/11  Admit date: 10/26/2022 Date of Consult: 10/26/2022  PCP:  Hal Morales, NP   [facility] Providers Cardiologist:  None   {  Patient Profile:   Bryce Costa is a 22 y.o. male with a hx of multiple MRSA infections who is being seen 10/26/2022 for the evaluation of chest pain with elevated troponin's.   History of Present Illness:   Bryce Costa has no prior cardiac or medical history.  Patient is a transfer from [facility] who is being evaluated for chest pain with elevated troponins of 1.25.  Recently patient was being evaluated for 2 insect bites and was given Bactrim given his history of multiple MRSA infections.   Patient states that early this morning at approximately 0200 he had sudden onset of chest pain that radiated to his right arm.  Patient denied any shortness of breath, diaphoresis, no exertional aspect to this chest pain, is not reproducible, and not pleuritic.  Patient thought it was a muscle spasm and started stretching.  Pain did not relieve so patient called EMS and during transfer had an episode of vomiting.  Overall episode lasted about 30 minutes and has not had any more complaints of chest pain.  Patient denies any fever, cough, nausea, abdominal pain, no association to food.  Denies any drug use.  Prior to this episode patient states that he has been complaining of worsening fatigue, diplopia, headache, vague GI symptoms for the past week.  Upon further questioning patient has had 2 bites on his left upper thigh and left knee and reports spending much of his time outdoors.  EKG showing normal sinus rhythm, heart rate 93.  Nonspecific T wave changes.  CTA negative for pulmonary embolism.  Chest x-ray negative.  Troponins 1.25   History reviewed. No pertinent past medical history.  History reviewed. No pertinent surgical history.    Inpatient Medications: Scheduled  Meds:  Continuous Infusions:  PRN Meds:   Allergies:    Allergies  Allergen Reactions   Wasp Venom Anaphylaxis    Social History:   Social History   Socioeconomic History   Marital status: Single    Spouse name: Not on file   Number of children: Not on file   Years of education: Not on file   Highest education level: Not on file  Occupational History   Not on file  Tobacco Use   Smoking status: Former    Types: E-cigarettes   Smokeless tobacco: Never  Vaping Use   Vaping Use: Former  Substance and Sexual Activity   Alcohol use: Never   Drug use: Never   Sexual activity: Not on file  Other Topics Concern   Not on file  Social History Narrative   Not on file   Social Determinants of Health   Financial Resource Strain: Not on file  Food Insecurity: No Food Insecurity (10/26/2022)   Hunger Vital Sign    Worried About Running Out of Food in the Last Year: Never true    Ran Out of Food in the Last Year: Never true  Transportation Needs: No Transportation Needs (10/26/2022)   PRAPARE - Administrator, Civil Service (Medical): No    Lack of Transportation (Non-Medical): No  Physical Activity: Not on file  Stress: Not on file  Social Connections: Not on file  Intimate Partner Violence: Not At Risk (10/26/2022)   Humiliation, Afraid, Rape, and Kick questionnaire  Fear of Current or Ex-Partner: No    Emotionally Abused: No    Physically Abused: No    Sexually Abused: No    Family History:   History reviewed. No pertinent family history.   ROS:  Please see the history of present illness.  All other ROS reviewed and negative.     Physical Exam/Data:   Vitals:   10/26/22 1440 10/26/22 1510 10/26/22 1553  BP: 116/67 (!) 95/56   Pulse: 87 64   Resp: 19    Temp: 98.6 F (37 C)    TempSrc: Oral    SpO2: 100% 97%   Weight:   54.9 kg  Height:   5\' 5"  (1.651 m)   No intake or output data in the 24 hours ending 10/26/22 1635    10/26/2022    3:53  PM 12/23/2020    2:18 PM 10/08/2018    9:16 AM  Last 3 Weights  Weight (lbs) 121 lb 111 lb 12.8 oz 131 lb 6.4 oz  Weight (kg) 54.885 kg 50.712 kg 59.603 kg     Body mass index is 20.14 kg/m.  General:  Well nourished, well developed, in no acute distress HEENT: normal Neck: no JVD Vascular: No carotid bruits; Distal pulses 2+ bilaterally Cardiac:  normal S1, S2; RRR; no murmur  Lungs:  clear to auscultation bilaterally, no wheezing, rhonchi or rales  Abd: soft, nontender, no hepatomegaly  Ext: no edema Musculoskeletal:  No deformities, BUE and BLE strength normal and equal Skin: insect bite on L upper thigh and knee.  Neuro:  CNs 2-12 intact, no focal abnormalities noted Psych:  Normal affect   EKG:  The EKG was personally reviewed and demonstrates: Normal sinus rhythm, heart rate 93.  Nonspecific T wave changes Telemetry:  Telemetry was personally reviewed and demonstrates: Normal sinus rhythm heart rate 60s  Relevant CV Studies:   Laboratory Data:  High Sensitivity Troponin:  No results for input(s): "TROPONINIHS" in the last 720 hours.   ChemistryNo results for input(s): "NA", "K", "CL", "CO2", "GLUCOSE", "BUN", "CREATININE", "CALCIUM", "MG", "GFRNONAA", "GFRAA", "ANIONGAP" in the last 168 hours.  No results for input(s): "PROT", "ALBUMIN", "AST", "ALT", "ALKPHOS", "BILITOT" in the last 168 hours. Lipids No results for input(s): "CHOL", "TRIG", "HDL", "LABVLDL", "LDLCALC", "CHOLHDL" in the last 168 hours.  HematologyNo results for input(s): "WBC", "RBC", "HGB", "HCT", "MCV", "MCH", "MCHC", "RDW", "PLT" in the last 168 hours. Thyroid No results for input(s): "TSH", "FREET4" in the last 168 hours.  BNPNo results for input(s): "BNP", "PROBNP" in the last 168 hours.  DDimer No results for input(s): "DDIMER" in the last 168 hours.   Radiology/Studies:  No results found.   Assessment and Plan:   Chest pain elevated troponin's Tick bite/lyme? Patient presenting with one-time  episode of chest pain radiating to his right arm.  He does report malaise and fatigue for the past 7 days and also has 2 new accompanied insect bites on his left upper thigh and knee during same time.  I do not suspect his current presentation to be ACS given young age and no risk factors.  It seems much more reasonable that his elevated troponins could be attributed to a tick bite given his vague symptoms of worsening fatigue, diplopia, headache, vomiting, for the past week.  It is possible that he might have developed Lyme related myocarditis.  Given such, order cardiac MRI. Medicine team will be admitting for further tick bite workup.   Chronic MRSA infections  Currently on bactrim. Recently  seen outpatient.      Risk Assessment/Risk Scores:   For questions or updates, please contact [facility] Please consult [email or web address] for contact info under    Signed, Abagail Kitchens, PA-C  10/26/2022 4:35 PM

## 2022-10-27 ENCOUNTER — Observation Stay (HOSPITAL_BASED_OUTPATIENT_CLINIC_OR_DEPARTMENT_OTHER): Payer: BC Managed Care – PPO

## 2022-10-27 DIAGNOSIS — I514 Myocarditis, unspecified: Secondary | ICD-10-CM | POA: Diagnosis not present

## 2022-10-27 DIAGNOSIS — R072 Precordial pain: Secondary | ICD-10-CM

## 2022-10-27 DIAGNOSIS — Z87891 Personal history of nicotine dependence: Secondary | ICD-10-CM | POA: Diagnosis not present

## 2022-10-27 DIAGNOSIS — R7989 Other specified abnormal findings of blood chemistry: Secondary | ICD-10-CM | POA: Diagnosis not present

## 2022-10-27 DIAGNOSIS — R079 Chest pain, unspecified: Secondary | ICD-10-CM | POA: Diagnosis not present

## 2022-10-27 DIAGNOSIS — B9562 Methicillin resistant Staphylococcus aureus infection as the cause of diseases classified elsewhere: Secondary | ICD-10-CM | POA: Diagnosis not present

## 2022-10-27 DIAGNOSIS — Z1152 Encounter for screening for COVID-19: Secondary | ICD-10-CM | POA: Diagnosis not present

## 2022-10-27 LAB — COMPREHENSIVE METABOLIC PANEL
ALT: 37 U/L (ref 0–44)
AST: 49 U/L — ABNORMAL HIGH (ref 15–41)
Albumin: 3.7 g/dL (ref 3.5–5.0)
Alkaline Phosphatase: 67 U/L (ref 38–126)
Anion gap: 9 (ref 5–15)
BUN: 7 mg/dL (ref 6–20)
CO2: 25 mmol/L (ref 22–32)
Calcium: 9 mg/dL (ref 8.9–10.3)
Chloride: 102 mmol/L (ref 98–111)
Creatinine, Ser: 0.93 mg/dL (ref 0.61–1.24)
GFR, Estimated: >60 mL/min (ref >60)
Glucose, Bld: 101 mg/dL — ABNORMAL HIGH (ref 70–99)
Potassium: 3.7 mmol/L (ref 3.5–5.1)
Sodium: 136 mmol/L (ref 135–145)
Total Bilirubin: 0.4 mg/dL (ref 0.3–1.2)
Total Protein: 6.7 g/dL (ref 6.5–8.1)

## 2022-10-27 LAB — CBC
HCT: 38.7 % — ABNORMAL LOW (ref 39.0–52.0)
Hemoglobin: 13 g/dL (ref 13.0–17.0)
MCH: 29.8 pg (ref 26.0–34.0)
MCHC: 33.6 g/dL (ref 30.0–36.0)
MCV: 88.8 fL (ref 80.0–100.0)
Platelets: 221 10*3/uL (ref 150–400)
RBC: 4.36 MIL/uL (ref 4.22–5.81)
RDW: 13.1 % (ref 11.5–15.5)
WBC: 6.5 10*3/uL (ref 4.0–10.5)
nRBC: 0 % (ref 0.0–0.2)

## 2022-10-27 LAB — TROPONIN I (HIGH SENSITIVITY)
Troponin I (High Sensitivity): 2123 ng/L (ref <18)
Troponin I (High Sensitivity): 1625 ng/L (ref <18)

## 2022-10-27 LAB — RAPID URINE DRUG SCREEN, HOSP PERFORMED
Amphetamines: NOT DETECTED
Barbiturates: NOT DETECTED
Benzodiazepines: NOT DETECTED
Cocaine: NOT DETECTED
Opiates: NOT DETECTED
Tetrahydrocannabinol: POSITIVE — AB

## 2022-10-27 MED ORDER — ACETAMINOPHEN ER 650 MG PO TBCR: 650.0000 mg | EXTENDED_RELEASE_TABLET | Freq: Three times a day (TID) | ORAL | 0 refills | Status: AC | PRN

## 2022-10-27 MED ORDER — IBUPROFEN 400 MG PO TABS: 400.0000 mg | ORAL_TABLET | Freq: Four times a day (QID) | ORAL | 0 refills | Status: AC | PRN

## 2022-10-27 MED ORDER — IBUPROFEN 200 MG PO TABS: 400.0000 mg | ORAL_TABLET | Freq: Three times a day (TID) | ORAL | Status: DC

## 2022-10-27 MED ORDER — GADOBUTROL 1 MMOL/ML IV SOLN
5.0000 mL | Freq: Once | INTRAVENOUS | Status: AC | PRN
  Administered 2022-10-27: 5 mL via INTRAVENOUS

## 2022-10-27 NOTE — Progress Notes (Signed)
    Subjective:  Denies SSCP, palpitations or Dyspnea For cardiac MRI this am   Objective:  Vitals:   10/26/22 1636 10/26/22 2143 10/27/22 0045 10/27/22 0407  BP: 118/73 119/84 108/80 108/69  Pulse: 75 74 63 60  Resp: 15 20 20 20   Temp: 98.3 F (36.8 C) 98.4 F (36.9 C)  98.6 F (37 C)  TempSrc: Oral Oral  Oral  SpO2: 99% 100% 99% 99%  Weight:      Height:        Intake/Output from previous day: No intake or output data in the 24 hours ending 10/27/22 0738  Physical Exam:  No distress Lungs clear No murmur Abdomen benign No edema Folliculits/ MRSA lesions left hip and leg   Lab Results: Basic Metabolic Panel: Recent Labs    10/27/22 0228  NA 136  K 3.7  CL 102  CO2 25  GLUCOSE 101*  BUN 7  CREATININE 0.93  CALCIUM 9.0   Liver Function Tests: Recent Labs    10/27/22 0228  AST 49*  ALT 37  ALKPHOS 67  BILITOT 0.4  PROT 6.7  ALBUMIN 3.7   No results for input(s): "LIPASE", "AMYLASE" in the last 72 hours. CBC: Recent Labs    10/27/22 0228  WBC 6.5  HGB 13.0  HCT 38.7*  MCV 88.8  PLT 221     Imaging: No results found.  Cardiac Studies:  ECG: pending her no acute changes in [place]   Telemetry:  NSR  Echo: Normal at [employer]  Medications:    sulfamethoxazole-trimethoprim  1 tablet Oral Q12H      Assessment/Plan:   Elevated Troponin:  at [place] use different assay Will check ECG and troponin x 2 here. ? Myocarditis. This is not an ischemic issue. TTE normal at [facility] Chest pain headache resolved Cardiac MRI this am Derm:  on Bactrim for MRSA and folliculitis   Bryce Costa 10/27/2022, 7:38 AM

## 2022-10-27 NOTE — Discharge Summary (Signed)
Physician Discharge Summary  Bryce Costa QMV:[id number] DOB: [birth year] DOA: 10/26/2022  PCP: Hal Morales, NP  Admit date: 10/26/2022 Discharge date: 10/27/2022 30 Day Unplanned Readmission Risk Score    Flowsheet Row Admission (Current) from 10/26/2022 in [facility]  30 Day Unplanned Readmission Risk Score (%) 4.05 Filed at 10/26/2022 1600       This score is the patient's risk of an unplanned readmission within 30 days of being discharged (0 -100%). The score is based on dignosis, age, lab data, medications, orders, and past utilization.   Low:  0-14.9   Medium: 15-21.9   High: 22-29.9   Extreme: 30 and above          Admitted From: Home Disposition: Home  Recommendations for Outpatient Follow-up:  Follow up with PCP in 1-2 weeks Please obtain BMP/CBC in one week Please follow up with your PCP on the following pending results: Unresulted Labs (From admission, onward)     Start     Ordered   10/27/22 0902  Lyme Disease Serology w/Reflex  Once,   R        10/27/22 0901              Home Health: None Equipment/Devices: None  Discharge Condition: Stable CODE STATUS: Full code Diet recommendation: Regular  Following HPI is copied from admitting hospitalist. HPI: Bryce Costa is a 22 y.o. male with medical history significant of folliculitis, allergic reaction presenting with chest pain as a transfer from [facility].   Patient presented to [facility] with chest pain earlier this morning.  He had sudden onset chest pain around 2 AM that radiated to his right arm.  At first he thought maybe it was a muscle spasm try to stretch it without relief.  EMS was called and patient was transported to the [place] ED.  En route he had some nausea and vomiting which is atypical for him.  Episode lasted around 30 minutes.  Chest pain-free at this time.  Additionally does report a week or so some vague GI symptoms but he states he has these  chronically with lactose intolerance and acid reflux.  He also reports some  intermittent headaches for the last week as well but no headaches today.  He additionally reports 2 lesions on his left leg similar to previous MRSA folliculitis that he takes Bactrim for as he has a history of having these occur on and off.   He denies fevers, chills, abdominal pain, shortness of breath, constipation, diarrhea.   ED Course: Vital signs at [facility] ED and thus far here are stable.  Lab workup included CMP within normal limits, CBC within normal limits, PT and INR within normal limits, D-dimer within normal limits, normal ESR, normal CRP.  Troponin elevated to 1.25, up trended to 2.15 where it peaked and then down trended to 2.1.  Reportedly had a CTA PE study performed which was negative for PE or other acute abnormality.  He was initially started on heparin and received aspirin and Tylenol.  Patient was accepted for transfer by cardiology service here.  Cardiology they are consulted and are okay with transfer, felt pericarditis/myocarditis would be primary concern.  Reportedly echo was done there which showed no wall motion abnormality and normal EF.  Patient transferred and upon cardiology evaluation here.  Plan for cardiac MRI but asked for hospitalist to admit as on their initial evaluation they were concerned that issues were noncardiac in nature.  Subjective: Seen and examined  BF as well.  No chest pain anymore.  Brief/Interim Summary: Patient was basically transferred from [facility] and admitted at [facility] under cardiology for evaluation by cardiology.  He had elevated troponin and at [facility].  We checked his troponins and they were significantly elevated up to 2000.  He was seen by cardiology, echo was not repeated since the echo done at [facility] was unremarkable.  However MRI cardiac was obtained which was indicative of myocarditis.  Troponins coming down.  He is chest  pain-free-cardiology cleared him for discharge.  They did not recommend colchicine however they recommended as needed Tylenol alternated with ibuprofen for pain.  No [place] activity for 2 weeks.  Viral panel negative.  UDS was positive for marijuana.  Cardiology will arrange follow-up as outpatient in 2 to 3 weeks.  Discharge plan was discussed with patient and/or family member, mother and fianc and they verbalized understanding and agreed with it.  Discharge Diagnoses:  Principal Problem:   Chest pain Active Problems:   Elevated troponin   Folliculitis   Myocarditis [facility])    Discharge Instructions   Allergies as of 10/27/2022       Reactions   Wasp Venom Anaphylaxis        Medication List     STOP taking these medications    ibuprofen 200 MG tablet Commonly known as: ADVIL       TAKE these medications    sulfamethoxazole-trimethoprim 800-160 MG tablet Commonly known as: BACTRIM DS Take 1 tablet by mouth 2 (two) times daily.        Follow-up Information     Hal Morales, NP Follow up in 1 week(s).   Specialty: Nurse Practitioner Contact information: [address] [place] [address] [phone]                Allergies  Allergen Reactions   Wasp Venom Anaphylaxis    Consultations: Cardiology   Procedures/Studies: MR CARDIAC MORPHOLOGY W WO CONTRAST  Result Date: 10/27/2022 CLINICAL DATA:  Elevated Troponin/ Myocarditis EXAM: CARDIAC MRI TECHNIQUE: The patient was scanned on a 1.5 Tesla Siemens magnet. A dedicated cardiac coil was used. Functional imaging was done using Fiesta sequences. 2,3, and 4 chamber views were done to assess for RWMA's. Modified Simpson's rule using a short axis stack was used to calculate an ejection fraction on a dedicated work [employer], [employer]. The patient received 6 cc of Gadavist. After 10 minutes inversion recovery sequences were used to assess for infiltration and scar tissue. CONTRAST:  Gadavist  FINDINGS: Normal atrial sizes. No PFO/ASD. No pericardial effusion. Pericardium normal thickness. Normal ascending thoracic aorta 2.4 cm. Normal LV size and function. No RWMAls. Normal RV size and function. Normal cardiac valves with tri leaflet aortic valve LVEF: 53% (EDV 119 cc ESV 56 cc SV 62 cc) Normal cardiac output 5.2 L/min RVEF: 53% (EDV 130 cc ESV 61 cc SV 69 cc ) Delayed gadolinium images no no uptake in myocardium or pericardium T2 STR images abnormal particularly in the mid inferior wall In this area T1 elevated at 1080 msec and T2 elevated at 59 msec. This meets [facility] criteria for myocarditis IMPRESSION: 1.  Normal LV size and function LVEF 53% no RWMAls 2.  No gadolinium uptake on delayed inversion recovery sequences 3. Elevated T1 1080 msec and elevated T2 particularly in mid inferior wall [facility] criteria for myocarditis more acute with no gadolinium uptake 4.  No pericardial effusion 5.  Normal  RV size and function RVEF 53% 6.  Normal cardiac valves 7.  Normal cardiac output 5.2 L/min Charlton Haws Electronically Signed   By: Charlton Haws M.D.   On: 10/27/2022 11:10     Discharge Exam: Vitals:   10/27/22 0407 10/27/22 0916  BP: 108/69 123/66  Pulse: 60 70  Resp: 20   Temp: 98.6 F (37 C) 98 F (36.7 C)  SpO2: 99% 97%   Vitals:   10/26/22 2143 10/27/22 0045 10/27/22 0407 10/27/22 0916  BP: 119/84 108/80 108/69 123/66  Pulse: 74 63 60 70  Resp: 20 20 20    Temp: 98.4 F (36.9 C)  98.6 F (37 C) 98 F (36.7 C)  TempSrc: Oral  Oral Oral  SpO2: 100% 99% 99% 97%  Weight:      Height:        General: Pt is alert, awake, not in acute distress Cardiovascular: RRR, S1/S2 +, no rubs, no gallops Respiratory: CTA bilaterally, no wheezing, no rhonchi Abdominal: Soft, NT, ND, bowel sounds + Extremities: no edema, no cyanosis    The results of significant diagnostics from this hospitalization (including imaging, microbiology, ancillary and laboratory) are listed  below for reference.     Microbiology: Recent Results (from the past 240 hour(s))  SARS Coronavirus 2 by RT PCR (hospital order, performed in [facility] hospital lab) *cepheid single result test* Anterior Nasal Swab     Status: None   Collection Time: 10/26/22  7:10 PM   Specimen: Anterior Nasal Swab  Result Value Ref Range Status   SARS Coronavirus 2 by RT PCR NEGATIVE NEGATIVE Final    Comment: Performed at [facility], [address] [place], [place] [address]  Respiratory (~20 pathogens) panel by PCR     Status: None   Collection Time: 10/26/22  7:10 PM   Specimen: Nasopharyngeal Swab; Respiratory  Result Value Ref Range Status   Adenovirus NOT DETECTED NOT DETECTED Final   Coronavirus 229E NOT DETECTED NOT DETECTED Final    Comment: (NOTE) The Coronavirus on the Respiratory Panel, DOES NOT test for the novel  Coronavirus (2019 nCoV)    Coronavirus HKU1 NOT DETECTED NOT DETECTED Final   Coronavirus NL63 NOT DETECTED NOT DETECTED Final   Coronavirus OC43 NOT DETECTED NOT DETECTED Final   Metapneumovirus NOT DETECTED NOT DETECTED Final   Rhinovirus / Enterovirus NOT DETECTED NOT DETECTED Final   Influenza A NOT DETECTED NOT DETECTED Final   Influenza B NOT DETECTED NOT DETECTED Final   Parainfluenza Virus 1 NOT DETECTED NOT DETECTED Final   Parainfluenza Virus 2 NOT DETECTED NOT DETECTED Final   Parainfluenza Virus 3 NOT DETECTED NOT DETECTED Final   Parainfluenza Virus 4 NOT DETECTED NOT DETECTED Final   Respiratory Syncytial Virus NOT DETECTED NOT DETECTED Final   Bordetella pertussis NOT DETECTED NOT DETECTED Final   Bordetella Parapertussis NOT DETECTED NOT DETECTED Final   Chlamydophila pneumoniae NOT DETECTED NOT DETECTED Final   Mycoplasma pneumoniae NOT DETECTED NOT DETECTED Final    Comment: Performed at [facility], [address] [place], [place] [address]     Labs: BNP (last 3 results) No results for input(s): "BNP" in the last 8760  hours. Basic Metabolic Panel: Recent Labs  Lab 10/27/22 0228  NA 136  K 3.7  CL 102  CO2 25  GLUCOSE 101*  BUN 7  CREATININE 0.93  CALCIUM 9.0   Liver Function Tests: Recent Labs  Lab 10/27/22 0228  AST 49*  ALT  37  ALKPHOS 67  BILITOT 0.4  PROT 6.7  ALBUMIN 3.7   No results for input(s): "LIPASE", "AMYLASE" in the last 168 hours. No results for input(s): "AMMONIA" in the last 168 hours. CBC: Recent Labs  Lab 10/27/22 0228  WBC 6.5  HGB 13.0  HCT 38.7*  MCV 88.8  PLT 221   Cardiac Enzymes: No results for input(s): "CKTOTAL", "CKMB", "CKMBINDEX", "TROPONINI" in the last 168 hours. BNP: Invalid input(s): "POCBNP" CBG: No results for input(s): "GLUCAP" in the last 168 hours. D-Dimer No results for input(s): "DDIMER" in the last 72 hours. Hgb A1c No results for input(s): "HGBA1C" in the last 72 hours. Lipid Profile No results for input(s): "CHOL", "HDL", "LDLCALC", "TRIG", "CHOLHDL", "LDLDIRECT" in the last 72 hours. Thyroid function studies No results for input(s): "TSH", "T4TOTAL", "T3FREE", "THYROIDAB" in the last 72 hours.  Invalid input(s): "FREET3" Anemia work up No results for input(s): "VITAMINB12", "FOLATE", "FERRITIN", "TIBC", "IRON", "RETICCTPCT" in the last 72 hours. Urinalysis No results found for: "COLORURINE", "APPEARANCEUR", "LABSPEC", "PHURINE", "GLUCOSEU", "HGBUR", "BILIRUBINUR", "KETONESUR", "PROTEINUR", "UROBILINOGEN", "NITRITE", "LEUKOCYTESUR" Sepsis Labs Recent Labs  Lab 10/27/22 0228  WBC 6.5   Microbiology Recent Results (from the past 240 hour(s))  SARS Coronavirus 2 by RT PCR (hospital order, performed in [facility] hospital lab) *cepheid single result test* Anterior Nasal Swab     Status: None   Collection Time: 10/26/22  7:10 PM   Specimen: Anterior Nasal Swab  Result Value Ref Range Status   SARS Coronavirus 2 by RT PCR NEGATIVE NEGATIVE Final    Comment: Performed at [facility], [address] [place],  [place] [address]  Respiratory (~20 pathogens) panel by PCR     Status: None   Collection Time: 10/26/22  7:10 PM   Specimen: Nasopharyngeal Swab; Respiratory  Result Value Ref Range Status   Adenovirus NOT DETECTED NOT DETECTED Final   Coronavirus 229E NOT DETECTED NOT DETECTED Final    Comment: (NOTE) The Coronavirus on the Respiratory Panel, DOES NOT test for the novel  Coronavirus (2019 nCoV)    Coronavirus HKU1 NOT DETECTED NOT DETECTED Final   Coronavirus NL63 NOT DETECTED NOT DETECTED Final   Coronavirus OC43 NOT DETECTED NOT DETECTED Final   Metapneumovirus NOT DETECTED NOT DETECTED Final   Rhinovirus / Enterovirus NOT DETECTED NOT DETECTED Final   Influenza A NOT DETECTED NOT DETECTED Final   Influenza B NOT DETECTED NOT DETECTED Final   Parainfluenza Virus 1 NOT DETECTED NOT DETECTED Final   Parainfluenza Virus 2 NOT DETECTED NOT DETECTED Final   Parainfluenza Virus 3 NOT DETECTED NOT DETECTED Final   Parainfluenza Virus 4 NOT DETECTED NOT DETECTED Final   Respiratory Syncytial Virus NOT DETECTED NOT DETECTED Final   Bordetella pertussis NOT DETECTED NOT DETECTED Final   Bordetella Parapertussis NOT DETECTED NOT DETECTED Final   Chlamydophila pneumoniae NOT DETECTED NOT DETECTED Final   Mycoplasma pneumoniae NOT DETECTED NOT DETECTED Final    Comment: Performed at [facility], [address] [place], [place] [address]    FURTHER DISCHARGE INSTRUCTIONS:   Get Medicines reviewed and adjusted: Please take all your medications with you for your next visit with your Primary MD   Laboratory/radiological data: Please request your Primary MD to go over all hospital tests and procedure/radiological results at the follow up, please ask your Primary MD to get all Hospital records sent to his/her office.   In some cases, they will be blood work, cultures and biopsy results pending  at the time of your discharge. Please request that your primary care M.D. goes through all the records  of your hospital data and follows up on these results.   Also Note the following: If you experience worsening of your admission symptoms, develop shortness of breath, life threatening emergency, suicidal or homicidal thoughts you must seek medical attention immediately by calling 911 or calling your MD immediately  if symptoms less severe.   You must read complete instructions/literature along with all the possible adverse reactions/side effects for all the Medicines you take and that have been prescribed to you. Take any new Medicines after you have completely understood and accpet all the possible adverse reactions/side effects.    Do not drive when taking Pain medications or sleeping medications (Benzodaizepines)   Do not take more than prescribed Pain, Sleep and Anxiety Medications. It is not advisable to combine anxiety,sleep and pain medications without talking with your primary care practitioner   Special Instructions: If you have smoked or chewed Tobacco  in the last 2 yrs please stop smoking, stop any regular Alcohol  and or any Recreational drug use.   Wear Seat belts while driving.   Please note: You were cared for by a hospitalist during your hospital stay. Once you are discharged, your primary care physician will handle any further medical issues. Please note that NO REFILLS for any discharge medications will be authorized once you are discharged, as it is imperative that you return to your primary care physician (or establish a relationship with a primary care physician if you do not have one) for your post hospital discharge needs so that they can reassess your need for medications and monitor your lab values  Time coordinating discharge: Over 30 minutes  SIGNED:   Hughie Closs, MD  [facility] 10/27/2022, 11:15 AM *Please note that this is a verbal dictation therefore any spelling or grammatical errors are due to the "Dragon Medical One" system interpretation. If  7PM-7AM, please contact night-coverage [email or web address]

## 2022-10-27 NOTE — Discharge Instructions (Signed)
no strenuous activity for 2 weeks

## 2022-10-27 NOTE — Progress Notes (Signed)
Reviewed MRI results with patient and family No scar or gadolinium uptake and no pericarditis but area of myocarditis with elevated T1 and T2 suggesting edema. Troponins coming down ECG no acute changes Chest pain is gone. There is no good data that colchicine helps isolated myocardits and GI side effects can be bad Would Rx with Tylenol and motrin mid dose 400 mg tid. Discussed no strenuous activity for 2 weeks He has normal telemetry and no arrhythmias. 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  Bactrim for his MRSA and f/u with primary. Lyme titer is pending but lesions not really consistent with this. I will arrange f/u with me in 2-3 weeks  Charlton Haws MD [facility]

## 2022-10-28 ENCOUNTER — Telehealth: Payer: Self-pay | Admitting: Cardiovascular Disease

## 2022-10-28 LAB — LYME DISEASE SEROLOGY W/REFLEX: Lyme Total Antibody EIA: NEGATIVE

## 2022-10-28 NOTE — Telephone Encounter (Signed)
Pt's mother is calling requesting appt with Dr. Eden Emms. Per Dr. Eden Emms in notes, pt is to follow up with him in 2-3 weeks. Currently there are no spots open for this. Requesting call back to schedule.

## 2022-10-28 NOTE — Telephone Encounter (Signed)
Called patient's mother back and made patient an appointment to see Dr. Eden Emms in a few weeks.

## 2022-11-01 NOTE — Progress Notes (Signed)
CARDIOLOGY CONSULT NOTE       Patient ID: Bryce Costa MRN: [id number] DOB/AGE: 2001/02/22 22 y.o.  Admit date:  6/19-20 2024  Primary Physician: Jerrye Bushy, FNP Primary Cardiologist: Eden Emms   HPI:  22 y.o. seen in hospital for myocarditis 10/26/22 SSCP with elevated troponin  2123 Transferred up from [place]. Rural lifestyle with chickens, and racing beagles/rabbits. Chronic MRSA infections since wrestling age 22. Uses Bactrim In hospital had two lesions one on left hip and one on LLE Cardiac MRI with elevated T1/T2 in mid inferior wall No gad uptake and no signs of pericardial effusion or pericarditis. ECG with no acute changes 20 point viral panel negative. Admits to smoking THC Drug screen otherwise negative May have gotten some bad weed. Lyme serology was negative HIV negative D/c with ASA and mid dose motrin. No colchicine as pericardium not involved. SSCP resolved on d/c Cardiac MRI otherwise structurally normal no valve dx and RV/LVEF 53% no RWMA;s  Back to work with mild muscular pain Needs to drink more fluid   ROS All other systems reviewed and negative except as noted above  History reviewed. No pertinent past medical history.  History reviewed. No pertinent family history.  Social History   Socioeconomic History   Marital status: Single    Spouse name: Not on file   Number of children: Not on file   Years of education: Not on file   Highest education level: Not on file  Occupational History   Not on file  Tobacco Use   Smoking status: Former    Types: E-cigarettes   Smokeless tobacco: Never  Vaping Use   Vaping Use: Former  Substance and Sexual Activity   Alcohol use: Never   Drug use: Never   Sexual activity: Not on file  Other Topics Concern   Not on file  Social History Narrative   Not on file   Social Determinants of Health   Financial Resource Strain: Not on file  Food Insecurity: No Food Insecurity (10/26/2022)   Hunger Vital Sign    Worried  About Running Out of Food in the Last Year: Never true    Ran Out of Food in the Last Year: Never true  Transportation Needs: No Transportation Needs (10/26/2022)   PRAPARE - Administrator, Civil Service (Medical): No    Lack of Transportation (Non-Medical): No  Physical Activity: Not on file  Stress: Not on file  Social Connections: Not on file  Intimate Partner Violence: Not At Risk (10/26/2022)   Humiliation, Afraid, Rape, and Kick questionnaire    Fear of Current or Ex-Partner: No    Emotionally Abused: No    Physically Abused: No    Sexually Abused: No    History reviewed. No pertinent surgical history.    Current Outpatient Medications:    acetaminophen (TYLENOL 8 HOUR) 650 MG CR tablet, Take 1 tablet (650 mg total) by mouth every 8 (eight) hours as needed for pain., Disp: 30 tablet, Rfl: 0   IBUPROFEN PO, Take by mouth. 450 as needed, Disp: , Rfl:     Physical Exam: Blood pressure 112/70, pulse 89, height 5\' 5"  (1.651 m), weight 114 lb (51.7 kg), SpO2 99 %.    Affect appropriate Healthy:  appears stated age HEENT: normal Neck supple with no adenopathy JVP normal no bruits no thyromegaly Lungs clear with no wheezing and good diaphragmatic motion Heart:  S1/S2 no murmur, no rub, gallop or click PMI normal Abdomen: benighn, BS positve,  no tenderness, no AAA no bruit.  No HSM or HJR Distal pulses intact with no bruits No edema Neuro non-focal Skin warm and dry Staph lesions on hip  No muscular weakness   Labs:   Lab Results  Component Value Date   WBC 6.5 10/27/2022   HGB 13.0 10/27/2022   HCT 38.7 (L) 10/27/2022   MCV 88.8 10/27/2022   PLT 221 10/27/2022    No results for input(s): "NA", "K", "CL", "CO2", "BUN", "CREATININE", "CALCIUM", "PROT", "BILITOT", "ALKPHOS", "ALT", "AST", "GLUCOSE" in the last 168 hours.  Invalid input(s): "LABALBU"  No results found for: "CKTOTAL", "CKMB", "CKMBINDEX", "TROPONINI" No results found for: "CHOL" No  results found for: "HDL" No results found for: "LDLCALC" No results found for: "TRIG" No results found for: "CHOLHDL" No results found for: "LDLDIRECT"    Radiology: MR CARDIAC MORPHOLOGY W WO CONTRAST  Result Date: 10/27/2022 CLINICAL DATA:  Elevated Troponin/ Myocarditis EXAM: CARDIAC MRI TECHNIQUE: The patient was scanned on a 1.5 Tesla Siemens magnet. A dedicated cardiac coil was used. Functional imaging was done using Fiesta sequences. 2,3, and 4 chamber views were done to assess for RWMA's. Modified Simpson's rule using a short axis stack was used to calculate an ejection fraction on a dedicated work [employer], [employer]. The patient received 6 cc of Gadavist. After 10 minutes inversion recovery sequences were used to assess for infiltration and scar tissue. CONTRAST:  Gadavist FINDINGS: Normal atrial sizes. No PFO/ASD. No pericardial effusion. Pericardium normal thickness. Normal ascending thoracic aorta 2.4 cm. Normal LV size and function. No RWMAls. Normal RV size and function. Normal cardiac valves with tri leaflet aortic valve LVEF: 53% (EDV 119 cc ESV 56 cc SV 62 cc) Normal cardiac output 5.2 L/min RVEF: 53% (EDV 130 cc ESV 61 cc SV 69 cc ) Delayed gadolinium images no no uptake in myocardium or pericardium T2 STR images abnormal particularly in the mid inferior wall In this area T1 elevated at 1080 msec and T2 elevated at 59 msec. This meets [facility] criteria for myocarditis IMPRESSION: 1.  Normal LV size and function LVEF 53% no RWMAls 2.  No gadolinium uptake on delayed inversion recovery sequences 3. Elevated T1 1080 msec and elevated T2 particularly in mid inferior wall [facility] criteria for myocarditis more acute with no gadolinium uptake 4.  No pericardial effusion 5.  Normal RV size and function RVEF 53% 6.  Normal cardiac valves 7.  Normal cardiac output 5.2 L/min Charlton Haws Electronically Signed   By: Charlton Haws M.D.   On: 10/27/2022 11:10    EKG: SR RAD  otherwise normal    ASSESSMENT AND PLAN:   Myocarditis:  no gad uptake or ECG changes Troponin > 2000 [place] criteria positive for elevated T1 and T2  Improving Only taking PRN Tylenol and Motrin Check troponin Exam and ECG are normal   Folliculitis:  with chronic MRSA Rx Bactrim 2 lesions are improved off Bactrim    Troponin  F/U 6 months  Signed: Charlton Haws 11/11/2022, 9:36 AM

## 2022-11-03 DIAGNOSIS — I514 Myocarditis, unspecified: Secondary | ICD-10-CM | POA: Diagnosis not present

## 2022-11-03 DIAGNOSIS — A4902 Methicillin resistant Staphylococcus aureus infection, unspecified site: Secondary | ICD-10-CM | POA: Diagnosis not present

## 2022-11-03 DIAGNOSIS — Z681 Body mass index (BMI) 19 or less, adult: Secondary | ICD-10-CM | POA: Diagnosis not present

## 2022-11-11 ENCOUNTER — Ambulatory Visit: Payer: BC Managed Care – PPO | Attending: Cardiovascular Disease | Admitting: Cardiovascular Disease

## 2022-11-11 ENCOUNTER — Encounter: Payer: Self-pay | Admitting: Cardiovascular Disease

## 2022-11-11 VITALS — BP 112/70 | HR 89 | Ht 65.0 in | Wt 114.0 lb

## 2022-11-11 DIAGNOSIS — I514 Myocarditis, unspecified: Secondary | ICD-10-CM

## 2022-11-11 LAB — TROPONIN T: Troponin T (Highly Sensitive): <6 ng/L (ref 0–22)

## 2022-11-11 NOTE — Patient Instructions (Addendum)
Medication Instructions:  Your physician recommends that you continue on your current medications as directed. Please refer to the Current Medication list given to you today.  *If you need a refill on your cardiac medications before your next appointment, please call your pharmacy*  Lab Work: Your physician recommends that you have lab work today- troponin   If you have labs (blood work) drawn today and your tests are completely normal, you will receive your results only by: MyChart Message (if you have MyChart) OR A paper copy in the mail If you have any lab test that is abnormal or we need to change your treatment, we will call you to review the results.  Testing/Procedures: None ordered today.  Follow-Up: At [facility], you and your health needs are our priority.  As part of our continuing mission to provide you with exceptional heart care, we have created designated Provider Care Teams.  These Care Teams include your primary Cardiologist (physician) and Advanced Practice Providers (APPs -  Physician Assistants and Nurse Practitioners) who all work together to provide you with the care you need, when you need it.  We recommend signing up for the patient portal called "MyChart".  Sign up information is provided on this After Visit Summary.  MyChart is used to connect with patients for Virtual Visits (Telemedicine).  Patients are able to view lab/test results, encounter notes, upcoming appointments, etc.  Non-urgent messages can be sent to your provider as well.   To learn more about what you can do with MyChart, go to [email or web address].    Your next appointment:   6 month(s)  Provider:   Charlton Haws, MD

## 2022-11-15 DIAGNOSIS — R0781 Pleurodynia: Secondary | ICD-10-CM | POA: Diagnosis not present

## 2023-03-16 ENCOUNTER — Ambulatory Visit: Payer: BC Managed Care – PPO | Admitting: Physician Assistant

## 2023-03-16 ENCOUNTER — Encounter: Payer: Self-pay | Admitting: Physician Assistant

## 2023-03-16 VITALS — BP 90/50 | HR 79 | Temp 97.5°F | Resp 12 | Ht 65.0 in | Wt 121.0 lb

## 2023-03-16 DIAGNOSIS — Z0000 Encounter for general adult medical examination without abnormal findings: Secondary | ICD-10-CM | POA: Diagnosis not present

## 2023-03-16 DIAGNOSIS — Z1159 Encounter for screening for other viral diseases: Secondary | ICD-10-CM

## 2023-03-16 DIAGNOSIS — Z23 Encounter for immunization: Secondary | ICD-10-CM

## 2023-03-16 DIAGNOSIS — F411 Generalized anxiety disorder: Secondary | ICD-10-CM | POA: Diagnosis not present

## 2023-03-16 DIAGNOSIS — I401 Isolated myocarditis: Secondary | ICD-10-CM | POA: Diagnosis not present

## 2023-03-16 DIAGNOSIS — R55 Syncope and collapse: Secondary | ICD-10-CM

## 2023-03-16 MED ORDER — ESCITALOPRAM OXALATE 5 MG PO TABS
5.0000 mg | ORAL_TABLET | Freq: Every day | ORAL | 1 refills | Status: DC
Start: 2023-03-16 — End: 2023-05-09

## 2023-03-16 NOTE — Progress Notes (Signed)
New Patient Office Visit  Subjective    Patient ID: Bryce Costa, male    DOB: [birth year]  Age: 22 y.o. MRN: [id number]  CC:  Chief Complaint  Patient presents with   New Patient (Initial Visit)    HPI Bryce Costa presents to establish care Patient mentioned to [place] [employer] and this is the 5th or 6th time it has happened in his life. He sees cardiology and they told him that he had vasovagal reflex that caused his syncope before. He will seen him in 6 months. Admits to having syncope previously about 6 months ago at work after he had taken on a new position at work and was having to learn all the things required. Stated it was a pretty stressful situation and it caused him to step down from the position.   Patient admits to worsening anxiety and that it has been causing him issues with his bowels. Admits to having more constipation than diarrhea. Will have some days where he goes multiple times when he is generally more anxious. Does admit to some pain before going but it is relieved after going to the bathroom. Admits to it being loose or string like or pellets if he has been constipated. Has not taken anything to help.   Has not had his Tdap updated in awhile. We will check the [place] registry and schedule that for next time due to the patient being somewhat anxious with needles.   Outpatient Encounter Medications as of 03/16/2023  Medication Sig   escitalopram (LEXAPRO) 5 MG tablet Take 1 tablet (5 mg total) by mouth daily.   [DISCONTINUED] IBUPROFEN PO Take by mouth. 450 as needed   No facility-administered encounter medications on file as of 03/16/2023.    History reviewed. No pertinent past medical history.  History reviewed. No pertinent surgical history.  History reviewed. No pertinent family history.  Social History   Socioeconomic History   Marital status: Married    Spouse name: Not on file   Number of children: Not on file   Years of education:  Not on file   Highest education level: Not on file  Occupational History   Not on file  Tobacco Use   Smoking status: Former    Types: E-cigarettes   Smokeless tobacco: Never  Vaping Use   Vaping status: Former  Substance and Sexual Activity   Alcohol use: Yes    Comment: rarely   Drug use: Yes    Types: Marijuana    Comment: 2 times a month   Sexual activity: Yes    Partners: Female  Other Topics Concern   Not on file  Social History Narrative   Not on file   Social Determinants of Health   Financial Resource Strain: Not on file  Food Insecurity: No Food Insecurity (10/26/2022)   Hunger Vital Sign    Worried About Running Out of Food in the Last Year: Never true    Ran Out of Food in the Last Year: Never true  Transportation Needs: No Transportation Needs (10/26/2022)   PRAPARE - Administrator, Civil Service (Medical): No    Lack of Transportation (Non-Medical): No  Physical Activity: Not on file  Stress: Not on file  Social Connections: Not on file  Intimate Partner Violence: Not At Risk (10/26/2022)   Humiliation, Afraid, Rape, and Kick questionnaire    Fear of Current or Ex-Partner: No    Emotionally Abused: No  Physically Abused: No    Sexually Abused: No    Review of Systems  Constitutional:  Negative for chills and fever.  Respiratory:  Negative for cough and shortness of breath.   Cardiovascular:  Negative for chest pain and palpitations.  Gastrointestinal:  Negative for abdominal pain, nausea and vomiting.  Genitourinary:  Negative for dysuria.  Neurological:  Negative for dizziness and headaches.  Psychiatric/Behavioral:  Negative for depression and suicidal ideas. The patient is nervous/anxious. The patient does not have insomnia.         Objective    BP (!) 90/50 (BP Location: Left Arm)   Pulse 79   Temp (!) 97.5 F (36.4 C)   Resp 12   Ht 5\' 5"  (1.651 m)   Wt 121 lb (54.9 kg)   SpO2 99%   BMI 20.14 kg/m   Physical  Exam Constitutional:      Appearance: Normal appearance.  HENT:     Right Ear: Tympanic membrane normal.     Left Ear: Tympanic membrane normal.     Nose: Nose normal.     Mouth/Throat:     Pharynx: No oropharyngeal exudate or posterior oropharyngeal erythema.  Eyes:     Conjunctiva/sclera: Conjunctivae normal.  Neck:     Vascular: No carotid bruit.  Cardiovascular:     Rate and Rhythm: Normal rate and regular rhythm.     Heart sounds: Normal heart sounds.  Pulmonary:     Effort: Pulmonary effort is normal.     Breath sounds: Normal breath sounds.  Abdominal:     General: Bowel sounds are normal.     Palpations: Abdomen is soft.     Tenderness: There is no abdominal tenderness.  Skin:    Findings: No lesion or rash.  Neurological:     Mental Status: He is alert and oriented to person, place, and time.  Psychiatric:        Behavior: Behavior normal.         Assessment & Plan:   Problem List Items Addressed This Visit     Myocarditis (HCC)    Controlled Currently being follow by Dr. Charlton Haws Has follow up in January Will continue to monitor symptoms      Relevant Orders   CBC with Differential/Platelet   Comprehensive metabolic panel   TSH   GAD (generalized anxiety disorder) - Primary    Prescribed Lexapro 5mg  Will follow up around 1 month to discuss medication efficacy. Will adjust treatment as needed       Relevant Medications   escitalopram (LEXAPRO) 5 MG tablet   Other Relevant Orders   Comprehensive metabolic panel   Lipid panel   VITAMIN D 25 Hydroxy (Vit-D Deficiency, Fractures)   Screening for viral disease   Relevant Orders   Hepatitis C antibody   Encounter for medical examination to establish care   Syncope and collapse    Has had two episodes in past year Labs drawn today Will see if it is vasovagal due to anxiety Will adjust treatment based on labs and anxiety management.      Encounter for immunization   Relevant Orders    Flu vaccine trivalent PF, 6mos and older(Flulaval,Afluria,Fluarix,Fluzone) (Completed)    Return in about 5 weeks (around 04/20/2023) for Chronic, Huston Foley.   Langley Gauss, [place]

## 2023-03-16 NOTE — Assessment & Plan Note (Signed)
Prescribed Lexapro 5mg  Will follow up around 1 month to discuss medication efficacy. Will adjust treatment as needed

## 2023-03-16 NOTE — Assessment & Plan Note (Signed)
Has had two episodes in past year Labs drawn today Will see if it is vasovagal due to anxiety Will adjust treatment based on labs and anxiety management.

## 2023-03-16 NOTE — Assessment & Plan Note (Signed)
Controlled Currently being follow by Dr. Charlton Haws Has follow up in January Will continue to monitor symptoms

## 2023-03-17 LAB — CBC WITH DIFFERENTIAL/PLATELET
Basophils Absolute: 0.1 10*3/uL (ref 0.0–0.2)
Basos: 1 %
EOS (ABSOLUTE): 0.2 10*3/uL (ref 0.0–0.4)
Eos: 3 %
Hematocrit: 42.7 % (ref 37.5–51.0)
Hemoglobin: 14 g/dL (ref 13.0–17.7)
Immature Grans (Abs): 0 10*3/uL (ref 0.0–0.1)
Immature Granulocytes: 0 %
Lymphocytes Absolute: 2.8 10*3/uL (ref 0.7–3.1)
Lymphs: 38 %
MCH: 29.5 pg (ref 26.6–33.0)
MCHC: 32.8 g/dL (ref 31.5–35.7)
MCV: 90 fL (ref 79–97)
Monocytes Absolute: 0.6 10*3/uL (ref 0.1–0.9)
Monocytes: 8 %
Neutrophils Absolute: 3.7 10*3/uL (ref 1.4–7.0)
Neutrophils: 50 %
Platelets: 333 10*3/uL (ref 150–450)
RBC: 4.75 x10E6/uL (ref 4.14–5.80)
RDW: 12.5 % (ref 11.6–15.4)
WBC: 7.4 10*3/uL (ref 3.4–10.8)

## 2023-03-17 LAB — LIPID PANEL
Chol/HDL Ratio: 2.1 ratio (ref 0.0–5.0)
Cholesterol, Total: 212 mg/dL — ABNORMAL HIGH (ref 100–199)
HDL: 100 mg/dL (ref >39)
LDL Chol Calc (NIH): 90 mg/dL (ref 0–99)
Triglycerides: 130 mg/dL (ref 0–149)
VLDL Cholesterol Cal: 22 mg/dL (ref 5–40)

## 2023-03-17 LAB — TSH: TSH: 2.67 u[IU]/mL (ref 0.450–4.500)

## 2023-03-17 LAB — COMPREHENSIVE METABOLIC PANEL
ALT: 19 [IU]/L (ref 0–44)
AST: 20 [IU]/L (ref 0–40)
Albumin: 5.1 g/dL (ref 4.3–5.2)
Alkaline Phosphatase: 98 [IU]/L (ref 44–121)
BUN/Creatinine Ratio: 20 (ref 9–20)
BUN: 14 mg/dL (ref 6–20)
Bilirubin Total: 0.4 mg/dL (ref 0.0–1.2)
CO2: 24 mmol/L (ref 20–29)
Calcium: 10 mg/dL (ref 8.7–10.2)
Chloride: 100 mmol/L (ref 96–106)
Creatinine, Ser: 0.7 mg/dL — ABNORMAL LOW (ref 0.76–1.27)
Globulin, Total: 2.7 g/dL (ref 1.5–4.5)
Glucose: 86 mg/dL (ref 70–99)
Potassium: 4.8 mmol/L (ref 3.5–5.2)
Sodium: 139 mmol/L (ref 134–144)
Total Protein: 7.8 g/dL (ref 6.0–8.5)
eGFR: 133 mL/min/{1.73_m2} (ref >59)

## 2023-03-17 LAB — HEPATITIS C ANTIBODY: Hep C Virus Ab: NONREACTIVE

## 2023-03-17 LAB — VITAMIN D 25 HYDROXY (VIT D DEFICIENCY, FRACTURES): Vit D, 25-Hydroxy: 31.8 ng/mL (ref 30.0–100.0)

## 2023-04-26 ENCOUNTER — Ambulatory Visit: Payer: BC Managed Care – PPO | Attending: Physician Assistant

## 2023-04-26 ENCOUNTER — Ambulatory Visit (INDEPENDENT_AMBULATORY_CARE_PROVIDER_SITE_OTHER): Payer: BC Managed Care – PPO | Admitting: Physician Assistant

## 2023-04-26 ENCOUNTER — Encounter: Payer: Self-pay | Admitting: Physician Assistant

## 2023-04-26 VITALS — BP 100/60 | HR 72 | Temp 98.3°F | Resp 16 | Ht 65.0 in | Wt 115.0 lb

## 2023-04-26 DIAGNOSIS — Z87828 Personal history of other (healed) physical injury and trauma: Secondary | ICD-10-CM

## 2023-04-26 DIAGNOSIS — F411 Generalized anxiety disorder: Secondary | ICD-10-CM | POA: Diagnosis not present

## 2023-04-26 DIAGNOSIS — R55 Syncope and collapse: Secondary | ICD-10-CM

## 2023-04-26 DIAGNOSIS — R293 Abnormal posture: Secondary | ICD-10-CM | POA: Insufficient documentation

## 2023-04-26 NOTE — Assessment & Plan Note (Addendum)
Referral sent to neurology MRI ordered to rule out structural abnormality Prior labs showed decreased kidney function. Need to ensure safe use of contrast for upcoming MRI. -Order blood work to assess current kidney function.

## 2023-04-26 NOTE — Assessment & Plan Note (Signed)
History of neck injury with persistent pain and episodes of syncope. Physical examination elicited symptoms of lightheadedness and near syncope. Possible decorticate posturing observed during the episode. No clear cardiac etiology identified on auscultation. -Order MRI of the neck to evaluate for potential structural abnormalities. -Refer to neurology for further evaluation and management. -Order Zeopatch for 14 days to monitor for potential arrhythmias.

## 2023-04-26 NOTE — Progress Notes (Signed)
Subjective:  Patient ID: Bryce Costa, male    DOB: September 02, 2000  Age: 22 y.o. MRN: [id number]  Chief Complaint  Patient presents with   Follow-up    HPI  Discussed the use of AI scribe software for clinical note transcription with the patient, who gave verbal consent to proceed.  History of Present Illness   Ian Malkin, a patient with a history of wrestling-related neck injury, presents for a follow-up visit after starting medication. The patient reports significant improvement in stomach discomfort since starting the medication. However, the patient expresses concern about a persistent pain in the neck and shoulder region, which he attributes to a wrestling injury sustained during his senior year. The pain is consistent and does not seem to be influenced by the patient's level of physical activity. The patient also reports episodes of fainting, which occur every few months and last about ten seconds. These episodes are often accompanied by temporary hearing loss and are sometimes preceded by a sensation of impending fainting. The patient has experienced these episodes in various settings, including at work and at home. The patient notes that these episodes began after the wrestling injury and have continued since then.           03/16/2023    9:51 AM  Depression screen PHQ 2/9  Decreased Interest 0  Down, Depressed, Hopeless 0  PHQ - 2 Score 0         03/28/2018    9:25 PM  Fall Risk  (RETIRED) Patient Fall Risk Level Low fall risk      Review of Systems  Constitutional:  Negative for chills, fatigue and fever.  HENT:  Negative for congestion, ear pain and sore throat.   Respiratory:  Negative for cough and shortness of breath.   Cardiovascular:  Negative for chest pain and palpitations.  Gastrointestinal:  Negative for abdominal pain, constipation, diarrhea, nausea and vomiting.  Genitourinary:  Negative for difficulty urinating and dysuria.  Musculoskeletal:  Positive for  arthralgias, myalgias and neck pain. Negative for back pain and neck stiffness.  Skin:  Negative for rash.  Neurological:  Positive for syncope and light-headedness. Negative for dizziness and headaches.  Psychiatric/Behavioral:  Negative for agitation, dysphoric mood and suicidal ideas. The patient is not nervous/anxious.     Current Outpatient Medications on File Prior to Visit  Medication Sig Dispense Refill   escitalopram (LEXAPRO) 5 MG tablet Take 1 tablet (5 mg total) by mouth daily. 30 tablet 1   No current facility-administered medications on file prior to visit.   No past medical history on file. No past surgical history on file.  No family history on file. Social History   Socioeconomic History   Marital status: Married    Spouse name: Not on file   Number of children: Not on file   Years of education: Not on file   Highest education level: Not on file  Occupational History   Not on file  Tobacco Use   Smoking status: Former    Types: E-cigarettes   Smokeless tobacco: Never  Vaping Use   Vaping status: Former  Substance and Sexual Activity   Alcohol use: Yes    Comment: rarely   Drug use: Yes    Types: Marijuana    Comment: 2 times a month   Sexual activity: Yes    Partners: Female  Other Topics Concern   Not on file  Social History Narrative   Not on file   Social Drivers of Health  Financial Resource Strain: Not on file  Food Insecurity: No Food Insecurity (10/26/2022)   Hunger Vital Sign    Worried About Running Out of Food in the Last Year: Never true    Ran Out of Food in the Last Year: Never true  Transportation Needs: No Transportation Needs (10/26/2022)   PRAPARE - Administrator, Civil Service (Medical): No    Lack of Transportation (Non-Medical): No  Physical Activity: Not on file  Stress: Not on file  Social Connections: Not on file    Objective:  BP 100/60 (BP Location: Right Arm, Patient Position: Sitting, Cuff Size:  Normal)   Pulse 72   Temp 98.3 F (36.8 C) (Temporal)   Resp 16   Ht 5\' 5"  (1.651 m)   Wt 115 lb (52.2 kg)   SpO2 98%   BMI 19.14 kg/m      04/26/2023    2:57 PM 03/16/2023    9:44 AM 03/16/2023    9:42 AM  BP/Weight  Systolic BP 100 90 86  Diastolic BP 60 50 40  Wt. (Lbs) 115  121  BMI 19.14 kg/m2  20.14 kg/m2    Physical Exam Vitals reviewed.  Constitutional:      Appearance: Normal appearance.  Eyes:     General: No visual field deficit. Cardiovascular:     Rate and Rhythm: Normal rate and regular rhythm.     Heart sounds: Normal heart sounds.  Pulmonary:     Effort: Pulmonary effort is normal.     Breath sounds: Normal breath sounds.  Abdominal:     General: Bowel sounds are normal.     Palpations: Abdomen is soft.     Tenderness: There is no abdominal tenderness.  Neurological:     Mental Status: He is alert and oriented to person, place, and time.     Cranial Nerves: Cranial nerve deficit present. No dysarthria or facial asymmetry.     Sensory: Sensation is intact.     Motor: Motor function is intact.     Coordination: Coordination is intact.     Gait: Gait is intact.     Comments: Active episode noticed in clinic during palpation to the right of C7 vertebral process. Episode lasted 10 seconds with the patient becoming incoherent and going into decorticate posturing with elbows and wrist flexed. No post ictal phase noted. Had decreased hearing for 30 seconds to 1 minute post episode.  Psychiatric:        Mood and Affect: Mood normal.        Behavior: Behavior normal.     Diabetic Foot Exam - Simple   No data filed      Lab Results  Component Value Date   WBC 7.4 03/16/2023   HGB 14.0 03/16/2023   HCT 42.7 03/16/2023   PLT 333 03/16/2023   GLUCOSE 86 03/16/2023   CHOL 212 (H) 03/16/2023   TRIG 130 03/16/2023   HDL 100 03/16/2023   LDLCALC 90 03/16/2023   ALT 19 03/16/2023   AST 20 03/16/2023   NA 139 03/16/2023   K 4.8 03/16/2023   CL 100  03/16/2023   CREATININE 0.70 (L) 03/16/2023   BUN 14 03/16/2023   CO2 24 03/16/2023   TSH 2.670 03/16/2023      Assessment & Plan:    Syncope and collapse Assessment & Plan: Zeo Patch ordered to monitor for possible arhythmia   Orders: -     Comprehensive metabolic panel -  LONG TERM MONITOR (3-14 DAYS); Future  Decorticate posturing Assessment & Plan: History of neck injury with persistent pain and episodes of syncope. Physical examination elicited symptoms of lightheadedness and near syncope. Possible decorticate posturing observed during the episode. No clear cardiac etiology identified on auscultation. -Order MRI of the neck to evaluate for potential structural abnormalities. -Refer to neurology for further evaluation and management. -Order Zeopatch for 14 days to monitor for potential arrhythmias.  Orders: -     Ambulatory referral to Neurology -     MR CERVICAL SPINE WO CONTRAST; Future  History of neck injury Assessment & Plan: Referral sent to neurology MRI ordered to rule out structural abnormality Prior labs showed decreased kidney function. Need to ensure safe use of contrast for upcoming MRI. -Order blood work to assess current kidney function.   Orders: -     Ambulatory referral to Neurology -     MR CERVICAL SPINE WO CONTRAST; Future  GAD (generalized anxiety disorder) Assessment & Plan: Patient reports improvement in symptoms with current medication regimen. -Continue current medication and follow-up in 3 months.      No orders of the defined types were placed in this encounter.   Orders Placed This Encounter  Procedures   MR CERVICAL SPINE WO CONTRAST   Comprehensive metabolic panel   Ambulatory referral to Neurology   LONG TERM MONITOR (3-14 DAYS)     Follow-up: No follow-ups on file. Assessment and Plan       I,Serrita Lueth,acting as a scribe for Langley Gauss, PA.,have documented all relevant documentation on the behalf of Langley Gauss, PA,as directed by  Langley Gauss, PA while in the presence of Langley Gauss, [place].   An After Visit Summary was printed and given to the patient.  Langley Gauss, [place] [facility] [phone]

## 2023-04-26 NOTE — Assessment & Plan Note (Signed)
Zeo Patch ordered to monitor for possible arhythmia

## 2023-04-26 NOTE — Assessment & Plan Note (Signed)
Patient reports improvement in symptoms with current medication regimen. -Continue current medication and follow-up in 3 months.

## 2023-04-27 LAB — COMPREHENSIVE METABOLIC PANEL
ALT: 15 [IU]/L (ref 0–44)
AST: 21 [IU]/L (ref 0–40)
Albumin: 4.8 g/dL (ref 4.3–5.2)
Alkaline Phosphatase: 87 [IU]/L (ref 44–121)
BUN/Creatinine Ratio: 18 (ref 9–20)
BUN: 15 mg/dL (ref 6–20)
Bilirubin Total: 0.3 mg/dL (ref 0.0–1.2)
CO2: 22 mmol/L (ref 20–29)
Calcium: 9.5 mg/dL (ref 8.7–10.2)
Chloride: 104 mmol/L (ref 96–106)
Creatinine, Ser: 0.84 mg/dL (ref 0.76–1.27)
Globulin, Total: 2.4 g/dL (ref 1.5–4.5)
Glucose: 103 mg/dL — ABNORMAL HIGH (ref 70–99)
Potassium: 5.1 mmol/L (ref 3.5–5.2)
Sodium: 140 mmol/L (ref 134–144)
Total Protein: 7.2 g/dL (ref 6.0–8.5)
eGFR: 126 mL/min/{1.73_m2} (ref >59)

## 2023-04-27 NOTE — Progress Notes (Unsigned)
EP to read

## 2023-05-01 ENCOUNTER — Encounter: Payer: Self-pay | Admitting: Physician Assistant

## 2023-05-07 ENCOUNTER — Encounter: Payer: Self-pay | Admitting: Physician Assistant

## 2023-05-08 NOTE — Telephone Encounter (Signed)
Pt was seen for Holter monitor on 04/26/23 by Cardiology [facility]. and you are aware of MRI needing additional information.

## 2023-05-09 ENCOUNTER — Other Ambulatory Visit: Payer: Self-pay

## 2023-05-09 DIAGNOSIS — F411 Generalized anxiety disorder: Secondary | ICD-10-CM

## 2023-05-09 MED ORDER — ESCITALOPRAM OXALATE 5 MG PO TABS: 5.0000 mg | ORAL_TABLET | Freq: Every day | ORAL | 1 refills | Status: DC

## 2023-05-24 ENCOUNTER — Other Ambulatory Visit: Payer: Self-pay | Admitting: Physician Assistant

## 2023-05-24 DIAGNOSIS — Z87828 Personal history of other (healed) physical injury and trauma: Secondary | ICD-10-CM

## 2023-05-27 DIAGNOSIS — R55 Syncope and collapse: Secondary | ICD-10-CM

## 2023-05-29 ENCOUNTER — Ambulatory Visit: Payer: Self-pay | Admitting: Physician Assistant

## 2023-05-29 NOTE — Telephone Encounter (Signed)
Copied from CRM [id number]. Topic: Clinical - Pink Word Triage >> May 29, 2023  9:51 AM Ivette P wrote: Reason for Triage:  Zio patch causing an itchy red rash all around where the adhesive is sticking. Wanted to know if there could get or possibly another adhesive to use. He wants to know if he can take this one off cause its so itch. requesting callback [phone]

## 2023-05-29 NOTE — Telephone Encounter (Signed)
Copied from CRM [id number]. Topic: Clinical - Pink Word Triage >> May 29, 2023  8:15 AM Geroge Baseman wrote: Reason for CRM: Zio patch causing an itchy red rash all around where the adhesive is sticking. Wanted to know if there could get or possibly another adhesive to use. He wants to know if he can take this one off cause its so itchy.  05/29/23-Called to f/u regarding skin reaction to Zio patch, no answer, left vmail.

## 2023-05-29 NOTE — Telephone Encounter (Signed)
  Chief Complaint: Skin reacting to adhesive of Zio cardiac monitor Symptoms: redness and itching to where adhesive touches skin Frequency: continual Pertinent Negatives: Patient denies open skin at this time Disposition: [] 911 / [] ED /[] Urgent Care (no appt availability in office) / [] Appointment(In office/virtual)/ []  [facility]/ [] Home Care/ [] Refused Recommended Disposition /[] [facility]/ [x]  Follow-up with PCP Additional Notes: Pt confirms that he was having redness and itching where adhesive from Zio holter monitor patch touches skin on chest. Pt reporting that he is wearing the monitor for 14 days per Craft PA for having passed out during exam with palpation of neck, Zio monitor was mailed to pt with only 1 adhesive patch. Pt confirms he has not yet taken off patch in order to ensure monitoring is uninterrupted as best possible. Advised that pt contact customer service line in Zio manual to request the extra patches he was supposed to receive, as patches for Zio are meant to be changed every 5 days, as well as the alternative telemetry set-up with probes and wires to better protect the skin and allow more frequent changing of adhesive and smaller surface area to skin, request asap. Advised that nurse would send HP message to practice to see if office by chance has any extra adhesive patches or alternative monitors in meantime, or see what doc would recommend in wanting to continue Holter monitor but preserve pt skin integrity. Pt verbalized understanding and will call Zio shortly. Please advise and call pt back asap.  Reason for Disposition  Medication patch causing local rash or itching    Not medication patch but patch for temporary 14-day cardiac monitoring  Answer Assessment - Initial Assessment Questions 1. APPEARANCE of RASH: "Describe the rash."      Itchy red rash all around where adhesive is sticking 2. LOCATION: "Where is the rash located?"      On chest  where Zio holter monitor goes 6. ITCHING: "Does the rash itch?" If Yes, ask: "How bad is the itch?"  (Scale 0-10; or none, mild, moderate, severe)     So itchy  Protocols used: Rash or Redness - Localized-A-AH

## 2023-05-29 NOTE — Telephone Encounter (Signed)
Copied from CRM [id number]. Topic: Clinical - Pink Word Triage >> May 29, 2023  8:15 AM Bryce Costa wrote: Reason for CRM: Zio patch causing an itchy red rash all around where the adhesive is sticking. Wanted to know if there could get or possibly another adhesive to use. He wants to know if he can take this one off cause its so itchy.   Chief Complaint: Rash  Additional Notes: Call placed to patient, no answer at this time, left voicemail. Callback queued.

## 2023-06-05 ENCOUNTER — Telehealth: Payer: Self-pay | Admitting: Cardiovascular Disease

## 2023-06-05 NOTE — Telephone Encounter (Signed)
Pt called in stating PCP suggested he reach out to Dr. Eden Emms about heart monitor. He states it fell off due to irritation on his skin. They asked if he has worn it long enough or should a different kind be ordered. Please advise.

## 2023-06-05 NOTE — Telephone Encounter (Signed)
Patient stated that monitor only stayed on for 7 days. Informed patient to go ahead and send monitor back in and wait for results. Will send message to Dr. Eden Emms, so he is aware.

## 2023-06-09 DIAGNOSIS — M542 Cervicalgia: Secondary | ICD-10-CM | POA: Diagnosis not present

## 2023-06-12 ENCOUNTER — Telehealth: Payer: Self-pay

## 2023-06-12 DIAGNOSIS — M542 Cervicalgia: Secondary | ICD-10-CM | POA: Diagnosis not present

## 2023-06-12 NOTE — Telephone Encounter (Signed)
[facility] PT INITIAL EVALUATION POC DATE:06/09/2023

## 2023-06-19 ENCOUNTER — Encounter: Payer: Self-pay | Admitting: Physician Assistant

## 2023-06-23 DIAGNOSIS — M542 Cervicalgia: Secondary | ICD-10-CM | POA: Diagnosis not present

## 2023-06-26 ENCOUNTER — Telehealth: Payer: Self-pay

## 2023-06-26 ENCOUNTER — Encounter: Payer: Self-pay | Admitting: Physician Assistant

## 2023-06-26 NOTE — Telephone Encounter (Signed)
 Copied from CRM [id number]. Topic: Clinical - Medical Advice >> Jun 26, 2023  9:51 AM Patsy Lager T wrote: Reason for CRM: patient called stated he has gone to PT for 4 weeks already and would like for provider to advise if he needs to continue. Patients say he was originally told by provider 4-5 weeks but therapist is saying until his next physicians appt.  Please f/u with patient

## 2023-06-26 NOTE — Progress Notes (Signed)
 CARDIOLOGY CONSULT NOTE       Patient ID: Bryce Costa MRN: [id number] DOB/AGE: [birth year] 23 y.o.  Admit date:  6/19-20 2024  Primary Physician: Langley Gauss, PA Primary Cardiologist: Eden Emms   HPI:  23 y.o. seen in hospital for myocarditis 10/26/22 SSCP with elevated troponin  2123 Transferred up from [place]. Rural lifestyle with chickens, and racing beagles/rabbits. Chronic MRSA infections since wrestling age 89. Uses Bactrim In hospital had two lesions one on left hip and one on LLE Cardiac MRI with elevated T1/T2 in mid inferior wall No gad uptake and no signs of pericardial effusion or pericarditis. ECG with no acute changes 20 point viral panel negative. Admits to smoking THC Drug screen otherwise negative May have gotten some bad weed. Lyme serology was negative HIV negative D/c with ASA and mid dose motrin. No colchicine as pericardium not involved. SSCP resolved on d/c Cardiac MRI otherwise structurally normal no valve dx and RV/LVEF 53% no RWMA;s  Back to work with mild muscular pain Needs to drink more fluid   Monitor 2.7.25 SR average HR 82 bpm. No PAF or sustained significant arrhythmias   New primary precipitated a "syncopal" episode in his office pressing on his C spine. MRI of spine denied by insurance Has had muscular neck/back pain.   ROS All other systems reviewed and negative except as noted above  History reviewed. No pertinent past medical history.  History reviewed. No pertinent family history.  Social History   Socioeconomic History   Marital status: Married    Spouse name: Not on file   Number of children: Not on file   Years of education: Not on file   Highest education level: Not on file  Occupational History   Not on file  Tobacco Use   Smoking status: Former    Types: E-cigarettes   Smokeless tobacco: Never  Vaping Use   Vaping status: Former  Substance and Sexual Activity   Alcohol use: Yes    Comment: rarely   Drug use: Yes    Types:  Marijuana    Comment: 2 times a month   Sexual activity: Yes    Partners: Female  Other Topics Concern   Not on file  Social History Narrative   Not on file   Social Drivers of Health   Financial Resource Strain: Not on file  Food Insecurity: No Food Insecurity (10/26/2022)   Hunger Vital Sign    Worried About Running Out of Food in the Last Year: Never true    Ran Out of Food in the Last Year: Never true  Transportation Needs: No Transportation Needs (10/26/2022)   PRAPARE - Administrator, Civil Service (Medical): No    Lack of Transportation (Non-Medical): No  Physical Activity: Not on file  Stress: Not on file  Social Connections: Not on file  Intimate Partner Violence: Not At Risk (10/26/2022)   Humiliation, Afraid, Rape, and Kick questionnaire    Fear of Current or Ex-Partner: No    Emotionally Abused: No    Physically Abused: No    Sexually Abused: No    History reviewed. No pertinent surgical history.    Current Outpatient Medications:    escitalopram (LEXAPRO) 5 MG tablet, Take 1 tablet (5 mg total) by mouth daily., Disp: 30 tablet, Rfl: 1    Physical Exam: Blood pressure 114/66, pulse 69, height 5\' 5"  (1.651 m), weight 122 lb 12.8 oz (55.7 kg), SpO2 98%.    Affect appropriate Healthy:  appears stated  age HEENT: normal Neck supple with no adenopathy JVP normal no bruits no thyromegaly Lungs clear with no wheezing and good diaphragmatic motion Heart:  S1/S2 no murmur, no rub, gallop or click PMI normal Abdomen: benighn, BS positve, no tenderness, no AAA no bruit.  No HSM or HJR Distal pulses intact with no bruits No edema Neuro non-focal Skin warm and dry Staph lesions on hip  No muscular weakness   Labs:   Lab Results  Component Value Date   WBC 7.4 03/16/2023   HGB 14.0 03/16/2023   HCT 42.7 03/16/2023   MCV 90 03/16/2023   PLT 333 03/16/2023    No results for input(s): "NA", "K", "CL", "CO2", "BUN", "CREATININE", "CALCIUM",  "PROT", "BILITOT", "ALKPHOS", "ALT", "AST", "GLUCOSE" in the last 168 hours.  Invalid input(s): "LABALBU"  No results found for: "CKTOTAL", "CKMB", "CKMBINDEX", "TROPONINI"  Lab Results  Component Value Date   CHOL 212 (H) 03/16/2023   Lab Results  Component Value Date   HDL 100 03/16/2023   Lab Results  Component Value Date   LDLCALC 90 03/16/2023   Lab Results  Component Value Date   TRIG 130 03/16/2023   Lab Results  Component Value Date   CHOLHDL 2.1 03/16/2023   No results found for: "LDLDIRECT"    Radiology: LONG TERM MONITOR (3-14 DAYS) Result Date: 06/16/2023 HR 40 - 185, average 82 bpm. Rare supraventricular and ventricular ectopy. No atrial fibrillation. No sustained arrhythmias. Sheria Lang T. Lalla Brothers, MD, [facility], [facility] Cardiac Electrophysiology    EKG: SR RAD otherwise normal    ASSESSMENT AND PLAN:   Myocarditis:  no gad uptake or ECG changes Troponin > 2000 [place] criteria positive for elevated T1 and T2  Improving Only taking PRN Tylenol and Motrin  Exam and ECG are normal  Check troponin make sure returned to normal  Folliculitis:  with chronic MRSA Rx Bactrim 2 lesions are improved off Bactrim  Palpitations:  benign monitor benign 06/16/23 vagal episode precipitated by C spine pressure.    Troponin  F/U in a year   Signed: Charlton Haws 07/05/2023, 8:26 AM

## 2023-06-30 DIAGNOSIS — M542 Cervicalgia: Secondary | ICD-10-CM | POA: Diagnosis not present

## 2023-07-05 ENCOUNTER — Other Ambulatory Visit: Payer: Self-pay

## 2023-07-05 ENCOUNTER — Ambulatory Visit: Payer: BC Managed Care – PPO | Attending: Cardiovascular Disease | Admitting: Cardiovascular Disease

## 2023-07-05 ENCOUNTER — Encounter: Payer: Self-pay | Admitting: Cardiovascular Disease

## 2023-07-05 VITALS — BP 114/66 | HR 69 | Ht 65.0 in | Wt 122.8 lb

## 2023-07-05 DIAGNOSIS — I514 Myocarditis, unspecified: Secondary | ICD-10-CM

## 2023-07-05 NOTE — Patient Instructions (Signed)
 Medication Instructions:  Your physician recommends that you continue on your current medications as directed. Please refer to the Current Medication list given to you today.  *If you need a refill on your cardiac medications before your next appointment, please call your pharmacy*  Lab Work: Your physician recommends that you have lab work today at any [facility]- Troponin   If you have labs (blood work) drawn today and your tests are completely normal, you will receive your results only by: [employer] (if you have MyChart) OR A paper copy in the mail If you have any lab test that is abnormal or we need to change your treatment, we will call you to review the results.  Testing/Procedures: None ordered today.  Follow-Up: At [facility], you and your health needs are our priority.  As part of our continuing mission to provide you with exceptional heart care, we have created designated Provider Care Teams.  These Care Teams include your primary Cardiologist (physician) and Advanced Practice Providers (APPs -  Physician Assistants and Nurse Practitioners) who all work together to provide you with the care you need, when you need it.  We recommend signing up for the patient portal called "MyChart".  Sign up information is provided on this After Visit Summary.  MyChart is used to connect with patients for Virtual Visits (Telemedicine).  Patients are able to view lab/test results, encounter notes, upcoming appointments, etc.  Non-urgent messages can be sent to your provider as well.   To learn more about what you can do with MyChart, go to [email or web address].    Your next appointment:   1 year(s)  Provider:   Charlton Haws, MD     Other Instructions       1st Floor: - Lobby - Registration  - Pharmacy  - Lab - Cafe  2nd Floor: - PV Lab - Diagnostic Testing (echo, CT, nuclear med)  3rd Floor: - Vacant  4th Floor: - TCTS (cardiothoracic surgery) - AFib  Clinic - Structural Heart Clinic - Vascular Surgery  - Vascular Ultrasound  5th Floor: - HeartCare Cardiology (general and EP) - Clinical Pharmacy for coumadin, hypertension, lipid, weight-loss medications, and med management appointments    Valet parking services will be available as well.

## 2023-07-07 DIAGNOSIS — M542 Cervicalgia: Secondary | ICD-10-CM | POA: Diagnosis not present

## 2023-07-14 DIAGNOSIS — M542 Cervicalgia: Secondary | ICD-10-CM | POA: Diagnosis not present

## 2023-07-27 ENCOUNTER — Ambulatory Visit (INDEPENDENT_AMBULATORY_CARE_PROVIDER_SITE_OTHER): Payer: BC Managed Care – PPO | Admitting: Physician Assistant

## 2023-07-27 ENCOUNTER — Encounter: Payer: Self-pay | Admitting: Physician Assistant

## 2023-07-27 VITALS — BP 102/60 | HR 65 | Resp 16 | Ht 65.0 in | Wt 123.0 lb

## 2023-07-27 DIAGNOSIS — R293 Abnormal posture: Secondary | ICD-10-CM | POA: Diagnosis not present

## 2023-07-27 DIAGNOSIS — Z87828 Personal history of other (healed) physical injury and trauma: Secondary | ICD-10-CM | POA: Diagnosis not present

## 2023-07-27 DIAGNOSIS — D229 Melanocytic nevi, unspecified: Secondary | ICD-10-CM | POA: Diagnosis not present

## 2023-07-27 NOTE — Progress Notes (Signed)
 Subjective:  Patient ID: Bryce Costa, male    DOB: 08/08/2000  Age: 23 y.o. MRN: [id number]  Chief Complaint  Patient presents with   Medical Management of Chronic Issues    71M     Discussed the use of AI scribe software for clinical note transcription with the patient, who gave verbal consent to proceed.  History of Present Illness   The patient, with a history of syncopal episodes, neck pain, headaches, and numbness and tingling in the hands, presents for a follow-up visit. He reports no recent syncopal episodes since the last visit. He has completed six weeks of physical therapy, which has resulted in feeling stronger in the neck and upper back area, but he still experiences pain. The frequency of headaches is about three to four times a month, some of which are migraines that last for three to four days. The patient manages these headaches with ibuprofen and Tylenol, which provide temporary relief. The patient also experiences numbness and tingling in the hands about once or twice a week, which often precedes the onset of a headache.  In addition to these symptoms, the patient has noticed several brown spots on his back. He has a family history of melanoma and has had moles removed in the past, which were not malignant. The patient is currently on Lexapro for an unspecified condition and reports no issues with the medication.          07/27/2023    4:24 PM 03/16/2023    9:51 AM  Depression screen PHQ 2/9  Decreased Interest 0 0  Down, Depressed, Hopeless 0 0  PHQ - 2 Score 0 0  Altered sleeping 1   Tired, decreased energy 1   Change in appetite 0   Feeling bad or failure about yourself  0   Trouble concentrating 0   Moving slowly or fidgety/restless 0   Suicidal thoughts 0   PHQ-9 Score 2   Difficult doing work/chores Not difficult at all         07/27/2023    4:24 PM  Fall Risk   Falls in the past year? 0  Number falls in past yr: 0  Injury with Fall? 0  Risk for  fall due to : No Fall Risks  Follow up Falls evaluation completed    Patient Care Team: Langley Gauss, [place] as PCP - General (Physician Assistant) Wendall Stade, MD as PCP - Cardiology (Cardiology)   Review of Systems  Constitutional:  Negative for chills, fatigue and fever.  HENT:  Negative for congestion, ear pain and sore throat.   Respiratory:  Negative for cough and shortness of breath.   Cardiovascular:  Negative for chest pain and palpitations.  Gastrointestinal:  Negative for abdominal pain, constipation, diarrhea, nausea and vomiting.  Genitourinary:  Negative for difficulty urinating and dysuria.  Musculoskeletal:  Negative for arthralgias, back pain and myalgias.  Skin:  Negative for rash.  Neurological:  Positive for headaches. Negative for dizziness.  Psychiatric/Behavioral:  Negative for confusion and dysphoric mood.     Current Outpatient Medications on File Prior to Visit  Medication Sig Dispense Refill   escitalopram (LEXAPRO) 5 MG tablet Take 1 tablet (5 mg total) by mouth daily. 30 tablet 1   No current facility-administered medications on file prior to visit.   History reviewed. No pertinent past medical history. History reviewed. No pertinent surgical history.  History reviewed. No pertinent family history. Social History   Socioeconomic History   Marital status: Married  Spouse name: Not on file   Number of children: Not on file   Years of education: Not on file   Highest education level: Not on file  Occupational History   Not on file  Tobacco Use   Smoking status: Former    Types: E-cigarettes   Smokeless tobacco: Never  Vaping Use   Vaping status: Former  Substance and Sexual Activity   Alcohol use: Yes    Comment: rarely   Drug use: Yes    Types: Marijuana    Comment: 2 times a month   Sexual activity: Yes    Partners: Female  Other Topics Concern   Not on file  Social History Narrative   Not on file   Social Drivers of Health    Financial Resource Strain: Not on file  Food Insecurity: No Food Insecurity (10/26/2022)   Hunger Vital Sign    Worried About Running Out of Food in the Last Year: Never true    Ran Out of Food in the Last Year: Never true  Transportation Needs: No Transportation Needs (10/26/2022)   PRAPARE - Administrator, Civil Service (Medical): No    Lack of Transportation (Non-Medical): No  Physical Activity: Not on file  Stress: Not on file  Social Connections: Not on file    Objective:  BP 102/60   Pulse 65   Resp 16   Ht 5\' 5"  (1.651 m)   Wt 123 lb (55.8 kg)   SpO2 96%   BMI 20.47 kg/m      07/27/2023    3:20 PM 07/05/2023    8:19 AM 04/26/2023    2:57 PM  BP/Weight  Systolic BP 102 114 100  Diastolic BP 60 66 60  Wt. (Lbs) 123 122.8 115  BMI 20.47 kg/m2 20.43 kg/m2 19.14 kg/m2    Physical Exam Vitals reviewed.  Constitutional:      Appearance: Normal appearance.  Cardiovascular:     Rate and Rhythm: Normal rate and regular rhythm.     Heart sounds: Normal heart sounds.  Pulmonary:     Effort: Pulmonary effort is normal.     Breath sounds: Normal breath sounds.  Abdominal:     General: Bowel sounds are normal.     Palpations: Abdomen is soft.     Tenderness: There is no abdominal tenderness.  Musculoskeletal:     Cervical back: Normal range of motion. Tenderness present.  Neurological:     Mental Status: He is alert and oriented to person, place, and time.  Psychiatric:        Mood and Affect: Mood normal.        Behavior: Behavior normal.     Diabetic Foot Exam - Simple   No data filed      Lab Results  Component Value Date   WBC 7.4 03/16/2023   HGB 14.0 03/16/2023   HCT 42.7 03/16/2023   PLT 333 03/16/2023   GLUCOSE 103 (H) 04/26/2023   CHOL 212 (H) 03/16/2023   TRIG 130 03/16/2023   HDL 100 03/16/2023   LDLCALC 90 03/16/2023   ALT 15 04/26/2023   AST 21 04/26/2023   NA 140 04/26/2023   K 5.1 04/26/2023   CL 104 04/26/2023    CREATININE 0.84 04/26/2023   BUN 15 04/26/2023   CO2 22 04/26/2023   TSH 2.670 03/16/2023      Assessment & Plan:  General Health Maintenance Pneumonia and HPV vaccines discussed and declined. Heart sounds normal. Lexapro well-tolerated. -  Plan for blood work in six months unless required sooner for MRI approval. - Continue Lexapro as prescribed.      Decorticate posturing Assessment & Plan: Chronic neck pain with headaches, numbness, and tingling. Symptoms persist despite physical therapy. Cardiology ruled out cardiac issues. Previous myocarditis idiopathic. MRI planned to investigate symptoms. - Order MRI of the neck. - If MRI is denied by insurance, refer to neurology. - Document physical therapy completion and persistent symptoms for insurance purposes.  Orders: -     Ambulatory referral to Neurology  History of neck injury Assessment & Plan: Chronic neck pain with headaches, numbness, and tingling. Symptoms persist despite physical therapy. Cardiology ruled out cardiac issues. Previous myocarditis idiopathic. MRI planned to investigate symptoms. - Order MRI of the neck. - If MRI is denied by insurance, refer to neurology. - Document physical therapy completion and persistent symptoms for insurance purposes.  Orders: -     Ambulatory referral to Neurology  Change in mole Assessment & Plan: Family history of melanoma. Multiple brown spots on back. Previous mole removals non-malignant. Discussed ABCs of dermatology. - Refer to dermatologist for evaluation of suspicious moles. - Document and photograph suspicious moles in his chart.  Orders: -     Ambulatory referral to Dermatology     No orders of the defined types were placed in this encounter.   Orders Placed This Encounter  Procedures   Ambulatory referral to Dermatology   Ambulatory referral to Neurology     Follow-up: Return in about 6 months (around 01/27/2024) for Chronic, Huston Foley.   Madelynn Done  Smith,acting as a Neurosurgeon for [employer], PA.,have documented all relevant documentation on the behalf of Langley Gauss, PA,as directed by  Langley Gauss, PA while in the presence of Langley Gauss, [place].   An After Visit Summary was printed and given to the patient.  Langley Gauss, [place] [facility] [phone]

## 2023-08-01 DIAGNOSIS — D229 Melanocytic nevi, unspecified: Secondary | ICD-10-CM | POA: Insufficient documentation

## 2023-08-01 NOTE — Assessment & Plan Note (Signed)
 Chronic neck pain with headaches, numbness, and tingling. Symptoms persist despite physical therapy. Cardiology ruled out cardiac issues. Previous myocarditis idiopathic. MRI planned to investigate symptoms. - Order MRI of the neck. - If MRI is denied by insurance, refer to neurology. - Document physical therapy completion and persistent symptoms for insurance purposes.

## 2023-08-01 NOTE — Assessment & Plan Note (Signed)
 Family history of melanoma. Multiple brown spots on back. Previous mole removals non-malignant. Discussed ABCs of dermatology. - Refer to dermatologist for evaluation of suspicious moles. - Document and photograph suspicious moles in his chart.

## 2023-08-02 ENCOUNTER — Other Ambulatory Visit: Payer: Self-pay

## 2023-08-02 DIAGNOSIS — F411 Generalized anxiety disorder: Secondary | ICD-10-CM

## 2023-08-02 MED ORDER — ESCITALOPRAM OXALATE 5 MG PO TABS
5.0000 mg | ORAL_TABLET | Freq: Every day | ORAL | 1 refills | Status: DC
Start: 2023-08-02 — End: 2024-01-29

## 2023-08-30 DIAGNOSIS — D485 Neoplasm of uncertain behavior of skin: Secondary | ICD-10-CM | POA: Diagnosis not present

## 2023-08-30 DIAGNOSIS — D225 Melanocytic nevi of trunk: Secondary | ICD-10-CM | POA: Diagnosis not present

## 2023-11-01 ENCOUNTER — Ambulatory Visit (HOSPITAL_BASED_OUTPATIENT_CLINIC_OR_DEPARTMENT_OTHER)
Admission: EM | Admit: 2023-11-01 | Discharge: 2023-11-01 | Disposition: A | Discharge status: Home / Self Care | Attending: Family Medicine | Admitting: Family Medicine

## 2023-11-01 ENCOUNTER — Encounter (HOSPITAL_BASED_OUTPATIENT_CLINIC_OR_DEPARTMENT_OTHER): Payer: Self-pay

## 2023-11-01 DIAGNOSIS — Z23 Encounter for immunization: Secondary | ICD-10-CM | POA: Diagnosis not present

## 2023-11-01 DIAGNOSIS — S61210A Laceration without foreign body of right index finger without damage to nail, initial encounter: Secondary | ICD-10-CM

## 2023-11-01 MED ORDER — TETANUS-DIPHTH-ACELL PERTUSSIS 5-2.5-18.5 LF-MCG/0.5 IM SUSY
0.5000 mL | PREFILLED_SYRINGE | Freq: Once | INTRAMUSCULAR | Status: AC
Start: 2023-11-01 — End: 2023-11-01
  Administered 2023-11-01: 0.5 mL via INTRAMUSCULAR

## 2023-11-01 NOTE — ED Provider Notes (Signed)
 [facility]    CSN: [id number] Arrival date & time: 11/01/23  1557      History   Chief Complaint Chief Complaint  Patient presents with   Laceration    HPI Amadeo Coke is a 23 y.o. male.   Patient is a 23 year old male who presents today with laceration to the right index finger.  He was work yesterday when a piece of metal from a steam radiator cut his finger.  He has since cleaned the area well and bandaged the area.  He today cleaned out with alcohol and peroxide.  Denies any significant pain to the area. Would like to have his tetanus updated   Laceration   History reviewed. No pertinent past medical history.  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 No pertinent surgical history.     Home Medications    Prior to Admission medications   Medication Sig Start Date End Date Taking? Authorizing Provider  escitalopram  (LEXAPRO ) 5 MG tablet Take 1 tablet (5 mg total) by mouth daily. 08/02/23   Milon Cleaves, PA    Family History History reviewed. No pertinent family history.  Social History Social History   Tobacco Use   Smoking status: Former    Types: E-cigarettes   Smokeless tobacco: Never  Vaping Use   Vaping status: Every Day  Substance Use Topics   Alcohol use: Yes    Comment: rarely   Drug use: Yes    Types: Marijuana    Comment: 2 times a month     Allergies   Wasp venom   Review of Systems Review of  Systems  See HPI Physical Exam Triage Vital Signs ED Triage Vitals  Encounter Vitals Group     BP 11/01/23 1634 126/81     Girls Systolic BP Percentile --      Girls Diastolic BP Percentile --      Boys Systolic BP Percentile --      Boys Diastolic BP Percentile --      Pulse Rate 11/01/23 1634 71     Resp 11/01/23 1634 20     Temp 11/01/23 1634 98.2 F (36.8 C)     Temp Source 11/01/23 1634 Oral     SpO2 11/01/23 1634 98 %     Weight --      Height --      Head Circumference --      Peak Flow --      Pain Score 11/01/23 1635 0     Pain Loc --      Pain Education --      Exclude from Growth Chart --    No data found.  Updated Vital Signs BP 126/81 (BP Location: Right Arm)   Pulse 71   Temp 98.2 F (36.8 C) (Oral)   Resp 20   SpO2 98%   Visual Acuity Right Eye Distance:   Left Eye Distance:   Bilateral Distance:    Right Eye Near:   Left  Eye Near:    Bilateral Near:     Physical Exam Constitutional:      Appearance: Normal appearance.  Pulmonary:     Effort: Pulmonary effort is normal.   Musculoskeletal:        General: Normal range of motion.   Skin:    General: Skin is warm and dry.     Comments: Approximate 1 cm superficial laceration to the right index finger by the nailbed.    Neurological:     Mental Status: He is alert.   Psychiatric:        Mood and Affect: Mood normal.      UC Treatments / Results  Labs (all labs ordered are listed, but only abnormal results are displayed) Labs Reviewed - No data to display  EKG   Radiology No results found.  Procedures Procedures (including critical care time)  Medications Ordered in UC Medications  Tdap (BOOSTRIX) injection 0.5 mL (0.5 mLs Intramuscular Given 11/01/23 1652)    Initial Impression / Assessment and Plan / UC Course  I have reviewed the triage vital signs and the nursing notes.  Pertinent labs & imaging results that were available during my care of the patient were  reviewed by me and considered in my medical decision making (see chart for details).     Right index finger laceration-no procedure needed today.  No need for suturing or Dermabond.  Patient only here for update on his tetanus. Recommendations to keep clean and follow-up as needed Final Clinical Impressions(s) / UC Diagnoses   Final diagnoses:  Laceration of right index finger without foreign body without damage to nail, initial encounter     Discharge Instructions      Tetanus updated today. Keep the wound clean. Follow up if needed.     ED Prescriptions   None    PDMP not reviewed this encounter.   Adah Wilbert LABOR, FNP 11/01/23 1655

## 2023-11-01 NOTE — Discharge Instructions (Signed)
 Tetanus updated today. Keep the wound clean. Follow up if needed.

## 2023-11-01 NOTE — ED Triage Notes (Signed)
 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 At the time of the injury he washed and bandaged the cut. Today he cleaned the cut with alcohol and peroxide. Unknown Tetanus status.

## 2024-01-24 NOTE — Progress Notes (Signed)
 Subjective:  Patient ID: Bryce Costa, male    DOB: 2001-03-20  Age: 23 y.o. MRN: [id number]  Chief Complaint  Patient presents with   Medical Management of Chronic Issues    HPI: Discussed the use of AI scribe software for clinical note transcription with the patient, who gave verbal consent to proceed.  History of Present Illness Bryce Costa is a 23 year old male who presents for a six month follow-up visit.  He has experienced no further episodes since his last visit and feels mentally improved after changing jobs. He has discontinued his previous medication, which was effective in the past.  He consulted a dermatologist for mole evaluation. One mole was excised and requires follow-up next month, while other moles were deemed non-concerning.  He completed physical therapy for neck pain but chose not to continue due to cost. He was provided with exercises to perform at home and reports improvement.  He has a family history of diabetes. Previous blood work showed slightly elevated cholesterol, but recent results indicate improvement with a total cholesterol of 174 mg/dL, down from 781 mg/dL. His HDL decreased from 100 mg/dL to 75 mg/dL.  He received a tetanus booster after a finger injury treated at urgent care. No symptoms necessitating a neurology consultation have been experienced. No belly pain.         01/29/2024    7:50 AM 07/27/2023    4:24 PM 03/16/2023    9:51 AM  Depression screen PHQ 2/9  Decreased Interest 0 0 0  Down, Depressed, Hopeless 0 0 0  PHQ - 2 Score 0 0 0  Altered sleeping 2 1   Tired, decreased energy 1 1   Change in appetite 1 0   Feeling bad or failure about yourself  0 0   Trouble concentrating 0 0   Moving slowly or fidgety/restless 0 0   Suicidal thoughts 0 0   PHQ-9 Score 4 2   Difficult doing work/chores Somewhat difficult Not difficult at all         01/29/2024    7:48 AM  Fall Risk   Falls in the past year? 0  Number falls in past  yr: 0  Injury with Fall? 0  Risk for fall due to : No Fall Risks    Patient Care Team: Milon Cleaves, [place] as PCP - General (Physician Assistant) Delford Maude BROCKS, MD as PCP - Cardiology (Cardiology)   Review of Systems  Constitutional:  Negative for appetite change, fatigue and fever.  HENT:  Negative for congestion, ear pain, sinus pressure and sore throat.   Respiratory:  Negative for cough, chest tightness, shortness of breath and wheezing.   Cardiovascular:  Negative for chest pain and palpitations.  Gastrointestinal:  Negative for abdominal pain, constipation, diarrhea, nausea and vomiting.  Genitourinary:  Negative for dysuria and hematuria.  Musculoskeletal:  Negative for arthralgias, back pain, joint swelling and myalgias.  Skin:  Negative for rash.  Neurological:  Negative for dizziness, weakness and headaches.  Psychiatric/Behavioral:  Negative for dysphoric mood. The patient is not nervous/anxious.     No current outpatient medications on file prior to visit.   No current facility-administered medications on file prior to visit.   History reviewed. No pertinent past medical history. History reviewed. No pertinent surgical history.  History reviewed. No pertinent family history. Social History   Socioeconomic History   Marital status: Married    Spouse name: Not on file   Number of children: Not on file  Years of education: Not on file   Highest education level: Not on file  Occupational History   Not on file  Tobacco Use   Smoking status: Former    Types: E-cigarettes   Smokeless tobacco: Never  Vaping Use   Vaping status: Every Day  Substance and Sexual Activity   Alcohol use: Yes    Comment: rarely   Drug use: Yes    Types: Marijuana    Comment: 2 times a month   Sexual activity: Yes    Partners: Female  Other Topics Concern   Not on file  Social History Narrative   Not on file   Social Drivers of Health   Financial Resource Strain: Not on file   Food Insecurity: No Food Insecurity (10/26/2022)   Hunger Vital Sign    Worried About Running Out of Food in the Last Year: Never true    Ran Out of Food in the Last Year: Never true  Transportation Needs: No Transportation Needs (10/26/2022)   PRAPARE - Administrator, Civil Service (Medical): No    Lack of Transportation (Non-Medical): No  Physical Activity: Not on file  Stress: Not on file  Social Connections: Not on file    Objective:  BP 116/86   Pulse 80   Temp (!) 97.5 F (36.4 C)   Ht 5' 5 (1.651 m)   Wt 130 lb 12.8 oz (59.3 kg)   SpO2 98%   BMI 21.77 kg/m      01/29/2024    7:46 AM 11/01/2023    4:34 PM 07/27/2023    3:20 PM  BP/Weight  Systolic BP 116 126 102  Diastolic BP 86 81 60  Wt. (Lbs) 130.8  123  BMI 21.77 kg/m2  20.47 kg/m2    Physical Exam Vitals reviewed.  Constitutional:      Appearance: Normal appearance.  Neck:     Vascular: No carotid bruit.  Cardiovascular:     Rate and Rhythm: Normal rate and regular rhythm.     Heart sounds: Normal heart sounds.  Pulmonary:     Effort: Pulmonary effort is normal.     Breath sounds: Normal breath sounds.  Abdominal:     General: Bowel sounds are normal.     Palpations: Abdomen is soft.     Tenderness: There is no abdominal tenderness.  Neurological:     Mental Status: He is alert and oriented to person, place, and time.  Psychiatric:        Mood and Affect: Mood normal.        Behavior: Behavior normal.       Lab Results  Component Value Date   WBC 7.4 03/16/2023   HGB 14.0 03/16/2023   HCT 42.7 03/16/2023   PLT 333 03/16/2023   GLUCOSE 103 (H) 04/26/2023   CHOL 212 (H) 03/16/2023   TRIG 130 03/16/2023   HDL 100 03/16/2023   LDLCALC 90 03/16/2023   ALT 15 04/26/2023   AST 21 04/26/2023   NA 140 04/26/2023   K 5.1 04/26/2023   CL 104 04/26/2023   CREATININE 0.84 04/26/2023   BUN 15 04/26/2023   CO2 22 04/26/2023   TSH 2.670 03/16/2023   HGBA1C 5.3 01/29/2024       Assessment & Plan:  Change in mole Assessment & Plan: One mole was removed during dermatology appointment. - Follow up with dermatology next month for re-evaluation of moles.   GAD (generalized anxiety disorder) Assessment & Plan: Currently well-managed with  no episodes reported. He discontinued medication and is in a better state of mind after switching jobs. - Monitor symptoms and consider reinitiating medication if symptoms recur.   Encounter for screening for cardiovascular disorders Assessment & Plan: Previous cholesterol levels were slightly elevated. Recent tests show improvement with total cholesterol decreasing from 218 to 174. HDL decreased from 100 to 75 but remains within normal limits. - Schedule blood work for March, with labs a few days prior to the visit.  Diabetes screening Family history of diabetes. Recent A1c test was 5.3, well below the threshold for type 2 diabetes. - Continue annual A1c screening.  Orders: -     POCT Lipid Panel -     POCT glycosylated hemoglobin (Hb A1C)  Encounter for immunization -     Flu vaccine trivalent PF, 6mos and older(Flulaval,Afluria,Fluarix,Fluzone)     Body mass index is 21.77 kg/m.    No orders of the defined types were placed in this encounter.   Orders Placed This Encounter  Procedures   Flu vaccine trivalent PF, 6mos and older(Flulaval,Afluria,Fluarix,Fluzone)   POCT Lipid Panel   POCT glycosylated hemoglobin (Hb A1C)       Follow-up: Return in about 6 months (around 07/28/2024) for Chronic, lab visit.   I,Lauren M Auman,acting as a Neurosurgeon for [employer], [employer].,have documented all relevant documentation on the behalf of Nola Angles, PA,as directed by  Nola Angles, PA while in the presence of Nola Angles, [place].   An After Visit Summary was printed and given to the patient.  Nola Angles, [place] [facility] [phone]

## 2024-01-29 ENCOUNTER — Encounter: Payer: Self-pay | Admitting: Physician Assistant

## 2024-01-29 ENCOUNTER — Ambulatory Visit: Payer: Self-pay | Admitting: Physician Assistant

## 2024-01-29 VITALS — BP 116/86 | HR 80 | Temp 97.5°F | Ht 65.0 in | Wt 130.8 lb

## 2024-01-29 DIAGNOSIS — Z136 Encounter for screening for cardiovascular disorders: Secondary | ICD-10-CM | POA: Insufficient documentation

## 2024-01-29 DIAGNOSIS — F411 Generalized anxiety disorder: Secondary | ICD-10-CM

## 2024-01-29 DIAGNOSIS — Z23 Encounter for immunization: Secondary | ICD-10-CM | POA: Diagnosis not present

## 2024-01-29 DIAGNOSIS — D229 Melanocytic nevi, unspecified: Secondary | ICD-10-CM | POA: Diagnosis not present

## 2024-01-29 LAB — POCT LIPID PANEL
HDL: 75
LDL: 87
Non-HDL: 98
TC: 174
TRG: 58

## 2024-01-29 LAB — POCT GLYCOSYLATED HEMOGLOBIN (HGB A1C): HbA1c POC (<> result, manual entry): 5.3 % (ref 4.0–5.6)

## 2024-01-29 NOTE — Assessment & Plan Note (Signed)
 One mole was removed during dermatology appointment. - Follow up with dermatology next month for re-evaluation of moles.

## 2024-01-29 NOTE — Patient Instructions (Signed)
  VISIT SUMMARY: During your six-month follow-up visit, we discussed your recent health updates and reviewed your progress. You have not experienced any further episodes of anxiety and feel mentally improved after changing jobs. You consulted a dermatologist for mole evaluation, and one mole was excised. You completed physical therapy for neck pain and report improvement. Your recent blood work shows improved cholesterol levels, and you received a tetanus booster after a finger injury. No symptoms necessitating a neurology consultation were reported.  YOUR PLAN: -MOLE EVALUATION: One mole was removed during your dermatology appointment. Please follow up with dermatology next month for re-evaluation of your moles.  -GENERALIZED ANXIETY DISORDER: Generalized anxiety disorder is a condition characterized by persistent and excessive worry. Your anxiety is currently well-managed with no episodes reported. You have discontinued medication and feel better after switching jobs. We will monitor your symptoms and consider reinitiating medication if symptoms recur.  -HYPERLIPIDEMIA: Hyperlipidemia is a condition where there are high levels of fats (lipids) in your blood. Your previous cholesterol levels were slightly elevated, but recent tests show improvement with your total cholesterol decreasing from 218 to 174. Your HDL decreased from 100 to 75 but remains within normal limits. We will schedule blood work for March, with labs a few days prior to the visit.  -DIABETES SCREENING: Given your family history of diabetes, we are monitoring your blood sugar levels. Your recent A1c test was 5.3, which is well below the threshold for type 2 diabetes. We will continue with annual A1c screening.  INSTRUCTIONS: Please follow up with dermatology next month for re-evaluation of your moles. Schedule blood work for March, with labs a few days prior to the visit.    Contains text generated by [employer].

## 2024-01-29 NOTE — Assessment & Plan Note (Signed)
 Previous cholesterol levels were slightly elevated. Recent tests show improvement with total cholesterol decreasing from 218 to 174. HDL decreased from 100 to 75 but remains within normal limits. - Schedule blood work for March, with labs a few days prior to the visit.  Diabetes screening Family history of diabetes. Recent A1c test was 5.3, well below the threshold for type 2 diabetes. - Continue annual A1c screening.

## 2024-01-29 NOTE — Assessment & Plan Note (Signed)
 Currently well-managed with no episodes reported. He discontinued medication and is in a better state of mind after switching jobs. - Monitor symptoms and consider reinitiating medication if symptoms recur.

## 2024-01-31 ENCOUNTER — Ambulatory Visit: Payer: Self-pay | Admitting: Physician Assistant

## 2024-07-25 ENCOUNTER — Other Ambulatory Visit

## 2024-07-29 ENCOUNTER — Ambulatory Visit: Admitting: Physician Assistant

## 2024-07-30 ENCOUNTER — Ambulatory Visit: Admitting: Physician Assistant
